# Patient Record
Sex: Male | Born: 1955 | Hispanic: No | Marital: Married
Health system: Northeastern US, Academic
[De-identification: ages and names within clinical notes are randomized; demographics above are authoritative.]

## PROBLEM LIST (undated history)

## (undated) DIAGNOSIS — E785 Hyperlipidemia, unspecified: Secondary | ICD-10-CM

## (undated) DIAGNOSIS — Z87442 Personal history of urinary calculi: Secondary | ICD-10-CM

## (undated) DIAGNOSIS — C4490 Unspecified malignant neoplasm of skin, unspecified: Secondary | ICD-10-CM

## (undated) DIAGNOSIS — I10 Essential (primary) hypertension: Secondary | ICD-10-CM

## (undated) DIAGNOSIS — I712 Thoracic aortic aneurysm, without rupture, unspecified: Secondary | ICD-10-CM

## (undated) DIAGNOSIS — N289 Disorder of kidney and ureter, unspecified: Secondary | ICD-10-CM

## (undated) HISTORY — DX: Hyperlipidemia, unspecified: E78.5

## (undated) HISTORY — PX: LITHOTRIPSY: SUR834

## (undated) HISTORY — PX: COLONOSCOPY: SHX174

## (undated) HISTORY — PX: OTHER SURGICAL HISTORY: SHX169

## (undated) HISTORY — PX: SKIN CANCER EXCISION: SHX779

---

## 2012-05-15 DIAGNOSIS — N289 Disorder of kidney and ureter, unspecified: Secondary | ICD-10-CM

## 2012-05-15 HISTORY — DX: Disorder of kidney and ureter, unspecified: N28.9

## 2012-12-26 ENCOUNTER — Emergency Department (HOSPITAL_BASED_OUTPATIENT_CLINIC_OR_DEPARTMENT_OTHER): Payer: 59

## 2012-12-26 ENCOUNTER — Encounter (HOSPITAL_BASED_OUTPATIENT_CLINIC_OR_DEPARTMENT_OTHER): Payer: Self-pay | Admitting: *Deleted

## 2012-12-26 ENCOUNTER — Emergency Department (HOSPITAL_BASED_OUTPATIENT_CLINIC_OR_DEPARTMENT_OTHER)
Admission: EM | Admit: 2012-12-26 | Discharge: 2012-12-26 | Disposition: A | Discharge status: Home / Self Care | Payer: 59 | Attending: Emergency Medicine | Admitting: Emergency Medicine

## 2012-12-26 DIAGNOSIS — Z7982 Long term (current) use of aspirin: Secondary | ICD-10-CM | POA: Insufficient documentation

## 2012-12-26 DIAGNOSIS — R079 Chest pain, unspecified: Secondary | ICD-10-CM

## 2012-12-26 DIAGNOSIS — R0609 Other forms of dyspnea: Secondary | ICD-10-CM | POA: Insufficient documentation

## 2012-12-26 DIAGNOSIS — R0989 Other specified symptoms and signs involving the circulatory and respiratory systems: Secondary | ICD-10-CM | POA: Insufficient documentation

## 2012-12-26 DIAGNOSIS — I10 Essential (primary) hypertension: Secondary | ICD-10-CM | POA: Insufficient documentation

## 2012-12-26 DIAGNOSIS — R0789 Other chest pain: Secondary | ICD-10-CM | POA: Insufficient documentation

## 2012-12-26 DIAGNOSIS — Z87442 Personal history of urinary calculi: Secondary | ICD-10-CM | POA: Insufficient documentation

## 2012-12-26 DIAGNOSIS — R0600 Dyspnea, unspecified: Secondary | ICD-10-CM

## 2012-12-26 LAB — COMPREHENSIVE METABOLIC PANEL
ALT: 26 U/L (ref 0–53)
Alkaline Phosphatase: 68 U/L (ref 39–117)
CO2: 25 mEq/L (ref 19–32)
Calcium: 9.5 mg/dL (ref 8.4–10.5)
Chloride: 105 mEq/L (ref 96–112)
GFR calc Af Amer: 84 mL/min — ABNORMAL LOW (ref >90)
GFR calc non Af Amer: 73 mL/min — ABNORMAL LOW (ref >90)
Glucose, Bld: 97 mg/dL (ref 70–99)
Sodium: 140 mEq/L (ref 135–145)
Total Bilirubin: 0.3 mg/dL (ref 0.3–1.2)

## 2012-12-26 LAB — CBC WITH DIFFERENTIAL/PLATELET
Eosinophils Relative: 3 % (ref 0–5)
HCT: 44.6 % (ref 39.0–52.0)
Lymphocytes Relative: 29 % (ref 12–46)
Lymphs Abs: 1.6 10*3/uL (ref 0.7–4.0)
MCV: 89.6 fL (ref 78.0–100.0)
Neutro Abs: 3.4 10*3/uL (ref 1.7–7.7)
Platelets: 202 10*3/uL (ref 150–400)
RBC: 4.98 MIL/uL (ref 4.22–5.81)
WBC: 5.7 10*3/uL (ref 4.0–10.5)

## 2012-12-26 MED ORDER — IOHEXOL 350 MG/ML SOLN
100.0000 mL | Freq: Once | INTRAVENOUS | Status: AC | PRN
Start: 2012-12-26 — End: 2012-12-26
  Administered 2012-12-26: 100 mL via INTRAVENOUS

## 2012-12-26 MED ORDER — GI COCKTAIL ~~LOC~~
30.0000 mL | Freq: Once | ORAL | Status: AC
  Administered 2012-12-26: 30 mL via ORAL
  Filled 2012-12-26: qty 30

## 2012-12-26 NOTE — ED Notes (Signed)
Patient is in no acute respiratory distress. He is speaking complete sentences. Breath sounds are equal and clear. Oxygen saturation is 99% on room air. Rt will continue to monitor.

## 2012-12-26 NOTE — ED Provider Notes (Signed)
History     CSN: [id number]  Arrival date & time 12/26/12  0906   First MD Initiated Contact with Patient 12/26/12 [phone]      Chief Complaint  Patient presents with  . Shortness of Breath    (Consider location/radiation/quality/duration/timing/severity/associated sxs/prior treatment) HPI Comments: Patient states was sleeping last night and woke with a sensation of not being able to breathe or catch his breath.  This lasted for several hours, then improved.  He went to work, then stood up and became dizzy and had slight shortness of breath.  He comes in for evaluation of this.  No fevers, chills.  No cough.  No ill contacts.  He has had these episodes before but this was the worst yet.  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  While in [place] several months ago, he had kidney stones and had a pre-op ekg performed.  He was told that this was suggestive of an old mi.  He then had a stress test and echocardiogram, and what sounds like a coronary ct.  He was told that all of there were okay.   Patient is a 57 y.o. male presenting with shortness of breath. The history is provided by the patient.  Shortness of Breath  Episode onset: early this morning. The problem occurs occasionally. The problem has been resolved. The problem is moderate. Nothing relieves the symptoms. Nothing aggravates the symptoms. Associated symptoms include shortness of breath.    Past Medical History  Diagnosis Date  . Hypertension     Past Surgical History  Procedure Date  . Kidney stones     No family history on file.  History  Substance Use Topics  . Smoking status: Never Smoker   . Smokeless tobacco: Not on file  . Alcohol Use: Yes      Review of Systems  Respiratory: Positive for shortness of breath.   All other systems reviewed and are negative.    Allergies  Review of patient's allergies indicates no known allergies.  Home Medications   Current Outpatient Rx  Name  Route   Sig  Dispense  Refill  . ASPIRIN 81 MG PO TABS   Oral   Take 81 mg by mouth daily.         . TESTOSTERONE TD   Transdermal   Place onto the skin.           BP 143/93  Pulse 77  Temp 97.6 F (36.4 C) (Oral)  Resp 20  Ht 5\' 11"  (1.803 m)  Wt 265 lb (120.203 kg)  BMI 36.96 kg/m2  SpO2 99%  Physical Exam  Nursing note and vitals reviewed. Constitutional: He is oriented to person, place, and time. He appears well-developed and well-nourished. No distress.  HENT:  Head: Normocephalic and atraumatic.  Mouth/Throat: Oropharynx is clear and moist.  Neck: Normal range of motion. Neck supple.  Cardiovascular: Normal rate and regular rhythm.   No murmur heard. Pulmonary/Chest: Effort normal and breath sounds normal. No respiratory distress. He has no wheezes.  Abdominal: Soft. Bowel sounds are normal. He exhibits no distension. There is no tenderness.  Musculoskeletal: Normal range of motion. He exhibits no edema.  Neurological: He is alert and oriented to person, place, and time.  Skin: Skin is warm and dry. He is not diaphoretic.    ED Course  Procedures (including critical care time)   Labs Reviewed  CBC WITH DIFFERENTIAL  COMPREHENSIVE METABOLIC PANEL  TROPONIN I   No  results found.   No diagnosis found.   Date: 12/26/2012  Rate: 72  Rhythm: normal sinus rhythm  QRS Axis: normal  Intervals: normal  ST/T Wave abnormalities: normal  Conduction Disutrbances:none  Narrative Interpretation:   Old EKG Reviewed: none available    MDM  The patient presents here with shortness of breath, tightness in chest that started in the night and woke him up.  He has had an extensive workup into this at [facility] two months ago, including perfusion scan, coronary ct, and stress testing.  I have obtained the records from this and all were negative with a low calcium score.  This does not appear to be cardiac.  The patient continues to have ongoing shortness of breath and is somewhat  apprehensive about discharge.  As he has been traveling for business, I did perform a ct angio of the chest to rule out pe.  This was negative.  At this point, I feel as though I have sufficiently ruled out nearly all emergent pathology.  He will be discharged to home.  To return prn.        Geoffery Lyons, MD 12/26/12 [phone]

## 2012-12-26 NOTE — ED Notes (Signed)
Patient states he woke up around 3am and was having trouble catching his breath, sweating. He states that last November when he had an ekg prior to surgery he was told he had a previous heart attack & had a stress test and was told there was no damage to the heart. Does not feel chest pain at this time, but still some sob

## 2018-08-21 ENCOUNTER — Emergency Department (HOSPITAL_BASED_OUTPATIENT_CLINIC_OR_DEPARTMENT_OTHER): Payer: Managed Care, Other (non HMO)

## 2018-08-21 ENCOUNTER — Encounter (HOSPITAL_BASED_OUTPATIENT_CLINIC_OR_DEPARTMENT_OTHER): Payer: Self-pay | Admitting: *Deleted

## 2018-08-21 ENCOUNTER — Other Ambulatory Visit: Payer: Self-pay

## 2018-08-21 ENCOUNTER — Emergency Department (HOSPITAL_BASED_OUTPATIENT_CLINIC_OR_DEPARTMENT_OTHER)
Admission: EM | Admit: 2018-08-21 | Discharge: 2018-08-21 | Disposition: A | Discharge status: Home / Self Care | Payer: Managed Care, Other (non HMO) | Attending: Emergency Medicine | Admitting: Emergency Medicine

## 2018-08-21 DIAGNOSIS — Z7982 Long term (current) use of aspirin: Secondary | ICD-10-CM | POA: Diagnosis not present

## 2018-08-21 DIAGNOSIS — I10 Essential (primary) hypertension: Secondary | ICD-10-CM | POA: Diagnosis not present

## 2018-08-21 DIAGNOSIS — R0789 Other chest pain: Secondary | ICD-10-CM | POA: Diagnosis not present

## 2018-08-21 DIAGNOSIS — Z79899 Other long term (current) drug therapy: Secondary | ICD-10-CM | POA: Diagnosis not present

## 2018-08-21 DIAGNOSIS — I712 Thoracic aortic aneurysm, without rupture: Secondary | ICD-10-CM | POA: Insufficient documentation

## 2018-08-21 DIAGNOSIS — R079 Chest pain, unspecified: Secondary | ICD-10-CM | POA: Diagnosis present

## 2018-08-21 LAB — CBC
HCT: 40.4 % (ref 39.0–52.0)
Hemoglobin: 14.7 g/dL (ref 13.0–17.0)
MCH: 32.3 pg (ref 26.0–34.0)
MCHC: 36.4 g/dL — AB (ref 30.0–36.0)
MCV: 88.8 fL (ref 78.0–100.0)
Platelets: 232 10*3/uL (ref 150–400)
RBC: 4.55 MIL/uL (ref 4.22–5.81)
RDW: 12.8 % (ref 11.5–15.5)
WBC: 9 10*3/uL (ref 4.0–10.5)

## 2018-08-21 LAB — BASIC METABOLIC PANEL
Anion gap: 10 (ref 5–15)
BUN: 24 mg/dL — AB (ref 8–23)
CALCIUM: 9.3 mg/dL (ref 8.9–10.3)
CO2: 25 mmol/L (ref 22–32)
Chloride: 100 mmol/L (ref 98–111)
Creatinine, Ser: 1.23 mg/dL (ref 0.61–1.24)
GFR calc Af Amer: >60 mL/min (ref >60)
GFR calc non Af Amer: >60 mL/min (ref >60)
GLUCOSE: 95 mg/dL (ref 70–99)
Potassium: 3.8 mmol/L (ref 3.5–5.1)
Sodium: 135 mmol/L (ref 135–145)

## 2018-08-21 LAB — TROPONIN I: Troponin I: <0.03 ng/mL (ref <0.03)

## 2018-08-21 NOTE — ED Provider Notes (Signed)
[facility] Provider Note   CSN: [id number] Arrival date & time: 08/21/18  2034     History   Chief Complaint Chief Complaint  Patient presents with  . Chest Pain    HPI John Hammond is a 62 y.o. male.  He has a history of a known thoracic aneurysm.  It sounds like this is being followed out of [facility].  He is visiting here for work.  He says he was at the gym bench pressing 300 pounds and left the weight on his chest and between reps.  He also wants to experienced a sudden left lateral sharp chest pain.  The pain continued while he was in the car afterwards and seems to be related to his position.  He rates it as 8 out of 10 at its maximum.  Now laying in bed he says the pain is essentially 0 and only is reproducible with palpation.  He also notices a little bit when he takes a deep breath.  He says he used to be able to bench this way but has not worked out in a while so this may have been excessive for him.  He has had cardiac testing before and says he has no known coronary disease.  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  No shortness of breath no leg pain or swelling.  Not on anticoagulation.  The history is provided by the patient.  Chest Pain   This is a new problem. The current episode started 1 to 2 hours ago. The problem has been rapidly improving. The pain is associated with exertion, lifting, movement and breathing. The pain is present in the lateral region. The pain is at a severity of 8/10. The quality of the pain is described as sharp. The pain does not radiate. The symptoms are aggravated by certain positions, deep breathing and exertion. Pertinent negatives include no abdominal pain, no back pain, no cough, no diaphoresis, no dizziness, no fever, no hemoptysis, no leg pain, no lower extremity edema, no nausea, no near-syncope, no numbness, no shortness of breath and  no vomiting. He has tried rest for the symptoms. The treatment provided moderate relief. Risk factors include male gender.  His past medical history is significant for aortic aneurysm and hypertension.    History reviewed. No pertinent past medical history.  There are no active problems to display for this patient.   Past Surgical History:  Procedure Laterality Date  . kidney stones          Home Medications    Prior to Admission medications   Medication Sig Start Date End Date Taking? Authorizing Provider  aspirin 81 MG tablet Take 81 mg by mouth daily.   Yes [provider]  losartan (COZAAR) 25 MG tablet Take 25 mg by mouth daily.   Yes [provider]  LOVASTATIN PO Take by mouth.   Yes [provider]  TESTOSTERONE TD Place onto the skin.    [provider]    Family History No family history on file.  Social History Social History   Tobacco Use  . Smoking status: Never Smoker  . Smokeless tobacco: Never Used  Substance Use Topics  . Alcohol use: Yes  . Drug use: No     Allergies   Patient has no known allergies.   Review of Systems Review of Systems  Constitutional: Negative  for diaphoresis and fever.  HENT: Negative for sore throat.   Eyes: Negative for visual disturbance.  Respiratory: Negative for cough, hemoptysis and shortness of breath.   Cardiovascular: Positive for chest pain. Negative for near-syncope.  Gastrointestinal: Negative for abdominal pain, nausea and vomiting.  Genitourinary: Negative for dysuria.  Musculoskeletal: Negative for back pain and neck pain.  Skin: Negative for rash.  Neurological: Negative for dizziness and numbness.     Physical Exam Updated Vital Signs BP 127/83   Pulse (!) 116   Temp 97.9 F (36.6 C) (Oral)   Resp 20   Ht 5' 11.5" (1.816 m)   Wt 122.5 kg   SpO2 93%   BMI 37.13 kg/m   Physical Exam  Constitutional: He appears well-developed and well-nourished.  HENT:    Head: Normocephalic and atraumatic.  Eyes: Conjunctivae are normal.  Neck: Neck supple.  Cardiovascular: Normal rate, regular rhythm and normal pulses.  No murmur heard. Pulmonary/Chest: Effort normal and breath sounds normal. No respiratory distress. He exhibits tenderness. He exhibits no crepitus.  He has some point chest wall tenderness on the anterior lateral left side lower rib cage.  Abdominal: Soft. There is no tenderness.  Musculoskeletal: Normal range of motion. He exhibits no edema.       Right lower leg: He exhibits no tenderness and no edema.       Left lower leg: He exhibits no tenderness and no edema.  Neurological: He is alert.  Skin: Skin is warm and dry.  Psychiatric: He has a normal mood and affect.  Nursing note and vitals reviewed.    ED Treatments / Results  Labs (all labs ordered are listed, but only abnormal results are displayed) Labs Reviewed  BASIC METABOLIC PANEL - Abnormal; Notable for the following components:      Result Value   BUN 24 (*)    All other components within normal limits  CBC - Abnormal; Notable for the following components:   MCHC 36.4 (*)    All other components within normal limits  TROPONIN I    EKG EKG Interpretation  Date/Time:  Monday August 21 2018 20:47:42 EDT Ventricular Rate:  113 PR Interval:  178 QRS Duration: 82 QT Interval:  320 QTC Calculation: 438 R Axis:   9 Text Interpretation:  Sinus tachycardia Inferior infarct , age undetermined Abnormal ECG increased rate from prior 1/14 Confirmed by Aletta Edouard [phone]) on 08/21/2018 8:58:14 PM   Radiology Dg Chest 2 View  Result Date: 08/21/2018 CLINICAL DATA:  LEFT lower chest pain while lifting weights at gym. EXAM: CHEST - 2 VIEW COMPARISON:  CT chest December 18, 2012 FINDINGS: Cardiomediastinal silhouette is unremarkable for this low inspiratory examination with crowded vasculature markings. The lungs are clear without pleural effusions or focal  consolidations. Similarly mildly elevated RIGHT hemidiaphragm. Trachea projects midline and there is no pneumothorax. Included soft tissue planes and osseous structures are non-suspicious. IMPRESSION: No active cardiopulmonary process. Electronically Signed   By: Elon Alas M.D.   On: 08/21/2018 22:00    Procedures Procedures (including critical care time)  Medications Ordered in ED Medications - No data to display   Initial Impression / Assessment and Plan / ED Course  I have reviewed the triage vital signs and the nursing notes.  Pertinent labs & imaging results that were available during my care of the patient were reviewed by me and considered in my medical decision making (see chart for details).  Clinical Course as of Aug 23 1127  Mon Aug 21, 2018  2137 Patient with a known thoracic aneurysm here with chest pain after bench pressing.  The clearly seems to be some reproducible component to his pain but he is at high risk with this known thoracic aneurysm that they have been following.  Initially he was agreeable to get the chest CT but now he is declining.  He looks very comfortable laying in bed and only can reproduce the pain with taking a deep breath or twisting.  He is not tachycardic here and not hypoxic.  He understands that we are talking about a serious outcome of his aneurysm is expanding or has dissected but he is declining the test.  He would like to follow-up with a specialist in [place].   [MB]  2211 Patient's initial results will come back and be unremarkable.  He is not interested in sticking out for a second troponin and does not want a chest CT.  He states he will come back if his symptoms get worse.  Now he is fairly comfortable that they are isolated chest wall.  He understands the risks of this and is able to recite them back to me.   [MB]    Clinical Course User Index [MB] Hayden Rasmussen, MD     Final Clinical Impressions(s) / ED Diagnoses   Final  diagnoses:  Atypical chest pain    ED Discharge Orders    None       Hayden Rasmussen, MD 08/22/18 1129

## 2018-08-21 NOTE — Discharge Instructions (Addendum)
You were evaluated in the emergency department for his left-sided chest pain that you experienced while you were lifting weights.  You had chest x-ray EKG and blood work that did not show an obvious cause of your pain.  I recommended that we get a second troponin and a CAT scan to evaluate for more serious causes of your pain but you declined this test.  Please follow-up with your doctor and return if any worsening symptoms.

## 2018-08-21 NOTE — ED Triage Notes (Addendum)
Chest pain x 20 minutes ago while at the gym. He is pale on arrival. Cardiac hx.

## 2018-08-21 NOTE — ED Notes (Signed)
Pt. John Hammond he would rather wait to get the CT of his aorta done back at his home.

## 2019-12-26 ENCOUNTER — Encounter (HOSPITAL_BASED_OUTPATIENT_CLINIC_OR_DEPARTMENT_OTHER): Payer: Self-pay | Admitting: Emergency Medicine

## 2019-12-26 ENCOUNTER — Emergency Department (HOSPITAL_BASED_OUTPATIENT_CLINIC_OR_DEPARTMENT_OTHER)
Admission: EM | Admit: 2019-12-26 | Discharge: 2019-12-26 | Disposition: A | Discharge status: Home / Self Care | Payer: BC Managed Care – PPO | Attending: Emergency Medicine | Admitting: Emergency Medicine

## 2019-12-26 ENCOUNTER — Other Ambulatory Visit: Payer: Self-pay

## 2019-12-26 ENCOUNTER — Emergency Department (HOSPITAL_BASED_OUTPATIENT_CLINIC_OR_DEPARTMENT_OTHER): Payer: BC Managed Care – PPO

## 2019-12-26 DIAGNOSIS — R0602 Shortness of breath: Secondary | ICD-10-CM | POA: Insufficient documentation

## 2019-12-26 DIAGNOSIS — Z8616 Personal history of COVID-19: Secondary | ICD-10-CM | POA: Diagnosis not present

## 2019-12-26 DIAGNOSIS — Z7982 Long term (current) use of aspirin: Secondary | ICD-10-CM | POA: Insufficient documentation

## 2019-12-26 DIAGNOSIS — Z79899 Other long term (current) drug therapy: Secondary | ICD-10-CM | POA: Diagnosis not present

## 2019-12-26 NOTE — ED Provider Notes (Signed)
[facility] Provider Note   CSN: [id number] Arrival date & time: 12/26/19  1214     History Chief Complaint  Patient presents with  . Shortness of Breath    John Hammond is a 64 y.o. male.  Patient with history of thoracic aortic aneurysm, reportedly stable over the past 6 years, followed twice a year at [facility] --presents with complaint of shortness of breath.  Patient was diagnosed with coronavirus about 2 weeks ago.  His symptoms have generally been improving however he has noted some shortness of breath that is exertional over the past several days.  Particularly pronounced when he is going upstairs.  He denies any chest pains.  No diaphoresis or exertional pain.  No lower extremity swelling or history of blood clots.  No back pain.  History of high blood pressure and high cholesterol.  Patient travels a lot for work and currently resides in [place].  Last international travel was 2 months ago to the [place].        History reviewed. No pertinent past medical history.  There are no problems to display for this patient.   Past Surgical History:  Procedure Laterality Date  . kidney stones         History reviewed. No pertinent family history.  Social History   Tobacco Use  . Smoking status: Never Smoker  . Smokeless tobacco: Never Used  Substance Use Topics  . Alcohol use: Yes  . Drug use: No    Home Medications Prior to Admission medications   Medication Sig Start Date End Date Taking? Authorizing Provider  aspirin 81 MG tablet Take 81 mg by mouth daily.    [provider]  losartan (COZAAR) 25 MG tablet Take 25 mg by mouth daily.    [provider]  LOVASTATIN PO Take by mouth.    [provider]  TESTOSTERONE TD Place onto the skin.    [provider]    Allergies    Patient has no known allergies.  Review of Systems   Review of Systems  Constitutional: Negative for fever.  HENT: Negative for  rhinorrhea and sore throat.   Eyes: Negative for redness.  Respiratory: Positive for shortness of breath. Negative for cough.   Cardiovascular: Negative for chest pain and leg swelling.  Gastrointestinal: Negative for abdominal pain, diarrhea, nausea and vomiting.  Genitourinary: Negative for dysuria.  Musculoskeletal: Negative for myalgias.  Skin: Negative for rash.  Neurological: Negative for headaches.    Physical Exam Updated Vital Signs BP (!) 149/99 (BP Location: Right Arm)   Pulse (!) 103   Temp 98.2 F (36.8 C) (Oral)   Resp 18   SpO2 96%   Physical Exam Vitals and nursing note reviewed.  Constitutional:      Appearance: He is well-developed. He is not diaphoretic.  HENT:     Head: Normocephalic and atraumatic.     Mouth/Throat:     Mouth: Mucous membranes are not dry.  Eyes:     Conjunctiva/sclera: Conjunctivae normal.  Neck:     Vascular: Normal carotid pulses. No carotid bruit or JVD.     Trachea: Trachea normal. No tracheal deviation.  Cardiovascular:     Rate and Rhythm: Normal rate and regular rhythm.     Pulses: No decreased pulses.     Heart sounds: Normal heart sounds, S1 normal and S2 normal. Heart sounds not distant. No murmur.  Pulmonary:     Effort: Pulmonary effort is normal. No respiratory  distress.     Breath sounds: Normal breath sounds. No wheezing, rhonchi or rales.     Comments: Patient speaks in full sentences without any apparent shortness of breath. Chest:     Chest wall: No tenderness.  Abdominal:     General: Bowel sounds are normal.     Palpations: Abdomen is soft.     Tenderness: There is no abdominal tenderness. There is no guarding or rebound.  Musculoskeletal:     Cervical back: Normal range of motion and neck supple. No muscular tenderness.  Skin:    General: Skin is warm and dry.     Coloration: Skin is not pale.  Neurological:     Mental Status: He is alert.     ED Results / Procedures / Treatments   Labs (all labs  ordered are listed, but only abnormal results are displayed) Labs Reviewed - No data to display  EKG EKG Interpretation  Date/Time:  Wednesday December 26 2019 14:25:36 EST Ventricular Rate:  80 PR Interval:    QRS Duration: 84 QT Interval:  382 QTC Calculation: 441 R Axis:   21 Text Interpretation: Sinus rhythm Low voltage, precordial leads No significant change since 9/19 Confirmed by Aletta Edouard [phone]) on 12/26/2019 2:28:54 PM   Radiology No results found.  Procedures Procedures (including critical care time)  Medications Ordered in ED Medications - No data to display  ED Course  I have reviewed the triage vital signs and the nursing notes.  Pertinent labs & imaging results that were available during my care of the patient were reviewed by me and considered in my medical decision making (see chart for details).  Patient seen and examined.  X-ray, EKG ordered.  Will ambulate patient.  Vital signs reviewed and are as follows: BP (!) 149/99 (BP Location: Right Arm)   Pulse (!) 103   Temp 98.2 F (36.8 C) (Oral)   Resp 18   SpO2 96%   2:33 PM patient ambulated and maintained oxygen saturation at 96%.  EKG reviewed.  Chest x-ray without significant pneumonia or other problems (not crossing over, reviewed in PACS).  Patient updated.  We will discharge home.  Patient counseled to monitor symptoms, return with worsening shortness of breath, trouble breathing, fevers, new symptoms or other concerns.  Patient verbalized understanding and agrees with plan.    MDM Rules/Calculators/A&P                      Patient with shortness of breath after coronavirus testing.  Other symptoms are improving.  No lower extremity signs and symptoms of DVT.  Patient without chest pain.  He has ambulated without hypoxia.  Chest x-ray, EKG, reassuring.  No features concerning for ACS.  Patient has a known chronic thoracic aortic aneurysm which is reportedly stable.  Symptoms today are not  consistent with complication from this.  Feel symptoms are consistent with resolving COVID-19 infection.   Final Clinical Impression(s) / ED Diagnoses Final diagnoses:  Shortness of breath  History of 2019 novel coronavirus disease (COVID-19)    Rx / DC Orders ED Discharge Orders    None       Carlisle Cater, PA-C 12/26/19 [place]    Hayden Rasmussen, MD 12/26/19 1815

## 2019-12-26 NOTE — ED Triage Notes (Signed)
Patient COVID + last Saturday and was feeling better before the last couple of days and started to become SOB on exertion.

## 2019-12-26 NOTE — Discharge Instructions (Signed)
Please read and follow all provided instructions.  Your diagnoses today include:  1. Shortness of breath     Tests performed today include:  Chest x-ray   EKG - normal  Vital signs. See below for your results today.   Medications prescribed:   None  Take any prescribed medications only as directed.  Home care instructions:  Follow any educational materials contained in this packet.  BE VERY CAREFUL not to take multiple medicines containing Tylenol (also called acetaminophen). Doing so can lead to an overdose which can damage your liver and cause liver failure and possibly death.   Follow-up instructions: Please follow-up with your primary care provider as needed for further evaluation of your symptoms.   Return instructions:   Please return to the Emergency Department if you experience worsening symptoms.   Return if you develop chest pain, worsening shortness of breath, fever.  Please return if you have any other emergent concerns.  Additional Information:  Your vital signs today were: BP (!) 149/99 (BP Location: Right Arm)   Pulse (!) 103   Temp 98.2 F (36.8 C) (Oral)   Resp 18   SpO2 96%  If your blood pressure (BP) was elevated above 135/85 this visit, please have this repeated by your doctor within one month. --------------

## 2019-12-31 ENCOUNTER — Telehealth: Admit: 2019-12-31 | Payer: PRIVATE HEALTH INSURANCE | Attending: Urology | Primary: Urology

## 2019-12-31 NOTE — Telephone Encounter
Spencer Nelson called and is asking for a prescription refill on his Androgel.  He has not been seen in more than one year, advised he needs an appt and he will need labs drawn.  He verbalized an understanding, He would like a call back from scheduling for an appt, will route the call to the scheduling pool.

## 2020-01-08 ENCOUNTER — Ambulatory Visit: Admit: 2020-01-08 | Payer: PRIVATE HEALTH INSURANCE | Attending: Cardiovascular Disease | Primary: Urology

## 2020-01-08 ENCOUNTER — Encounter: Admit: 2020-01-08 | Payer: PRIVATE HEALTH INSURANCE | Attending: Cardiovascular Disease | Primary: Urology

## 2020-01-30 ENCOUNTER — Ambulatory Visit: Admit: 2020-01-30 | Payer: PRIVATE HEALTH INSURANCE | Attending: Urology | Primary: Urology

## 2020-05-23 ENCOUNTER — Telehealth: Admit: 2020-05-23 | Payer: PRIVATE HEALTH INSURANCE | Attending: Urology | Primary: Urology

## 2020-05-23 NOTE — Telephone Encounter
Pt requesting PSA labs

## 2020-05-23 NOTE — Telephone Encounter
Last seen by Dr. Daphine Deutscher in 2017. Was previously seen by Dr. Laveda Norman at [facility] in 2019. No PSA recorded since 2019.I let him know that he has not been seen since 2017 and he will need to keep his appointment for orders to be placed and to be evaluated. He was understanding. I let him know if he see's his PCP regularly he can ask them to order in the meantime. He said he travels often and does not f/u with his physicians regularly. I let him know I would inform Dr. Daphine Deutscher in the event he wanted to order something now but likely that he would need to come into the office to establish a relationship again and at that point we would order blood work.

## 2020-06-09 ENCOUNTER — Encounter: Admit: 2020-06-09 | Payer: PRIVATE HEALTH INSURANCE | Attending: Cardiovascular Disease | Primary: Urology

## 2020-06-13 MED ORDER — LOVASTATIN 10 MG TABLET
10 mg | ORAL_TABLET | 2 refills | Status: AC
Start: 2020-06-13 — End: 2022-10-07

## 2020-06-30 ENCOUNTER — Ambulatory Visit: Payer: BC Managed Care – PPO | Admitting: Podiatry

## 2020-08-16 ENCOUNTER — Encounter: Admit: 2020-08-16 | Payer: PRIVATE HEALTH INSURANCE | Attending: Cardiovascular Disease | Primary: Urology

## 2020-08-18 ENCOUNTER — Encounter: Admit: 2020-08-18 | Payer: PRIVATE HEALTH INSURANCE | Attending: Cardiovascular Disease | Primary: Urology

## 2020-08-21 MED ORDER — LOSARTAN 50 MG TABLET
50 mg | ORAL_TABLET | 1 refills | Status: AC
Start: 2020-08-21 — End: 2020-11-14

## 2020-10-09 ENCOUNTER — Ambulatory Visit: Admit: 2020-10-09 | Payer: PRIVATE HEALTH INSURANCE | Attending: Urology | Primary: Urology

## 2020-11-14 ENCOUNTER — Encounter: Admit: 2020-11-14 | Payer: PRIVATE HEALTH INSURANCE | Attending: Cardiovascular Disease | Primary: Urology

## 2020-11-14 MED ORDER — LOSARTAN 50 MG TABLET
50 mg | ORAL_TABLET | 1 refills | Status: AC
Start: 2020-11-14 — End: 2021-05-22

## 2020-11-14 NOTE — Telephone Encounter
Script pended for your signature, thank you

## 2021-02-25 ENCOUNTER — Ambulatory Visit: Admit: 2021-02-25 | Payer: PRIVATE HEALTH INSURANCE | Attending: Cardiovascular Disease | Primary: Urology

## 2021-02-27 ENCOUNTER — Emergency Department (HOSPITAL_BASED_OUTPATIENT_CLINIC_OR_DEPARTMENT_OTHER)
Admission: EM | Admit: 2021-02-27 | Discharge: 2021-02-28 | Disposition: A | Discharge status: Home / Self Care | Payer: BC Managed Care – PPO | Attending: Emergency Medicine | Admitting: Emergency Medicine

## 2021-02-27 ENCOUNTER — Emergency Department (HOSPITAL_BASED_OUTPATIENT_CLINIC_OR_DEPARTMENT_OTHER): Payer: BC Managed Care – PPO

## 2021-02-27 ENCOUNTER — Other Ambulatory Visit: Payer: Self-pay

## 2021-02-27 ENCOUNTER — Encounter (HOSPITAL_BASED_OUTPATIENT_CLINIC_OR_DEPARTMENT_OTHER): Payer: Self-pay | Admitting: Emergency Medicine

## 2021-02-27 DIAGNOSIS — R000 Tachycardia, unspecified: Secondary | ICD-10-CM | POA: Diagnosis not present

## 2021-02-27 DIAGNOSIS — J069 Acute upper respiratory infection, unspecified: Secondary | ICD-10-CM

## 2021-02-27 DIAGNOSIS — Z79899 Other long term (current) drug therapy: Secondary | ICD-10-CM | POA: Diagnosis not present

## 2021-02-27 DIAGNOSIS — I10 Essential (primary) hypertension: Secondary | ICD-10-CM | POA: Insufficient documentation

## 2021-02-27 DIAGNOSIS — R059 Cough, unspecified: Secondary | ICD-10-CM | POA: Diagnosis present

## 2021-02-27 DIAGNOSIS — Z7982 Long term (current) use of aspirin: Secondary | ICD-10-CM | POA: Insufficient documentation

## 2021-02-27 DIAGNOSIS — Z20822 Contact with and (suspected) exposure to covid-19: Secondary | ICD-10-CM | POA: Insufficient documentation

## 2021-02-27 HISTORY — DX: Essential (primary) hypertension: I10

## 2021-02-27 HISTORY — DX: Disorder of kidney and ureter, unspecified: N28.9

## 2021-02-27 LAB — CBC WITH DIFFERENTIAL/PLATELET
Abs Immature Granulocytes: 0.03 10*3/uL (ref 0.00–0.07)
Basophils Absolute: 0 10*3/uL (ref 0.0–0.1)
Basophils Relative: 1 %
Eosinophils Absolute: 0.2 10*3/uL (ref 0.0–0.5)
Eosinophils Relative: 2 %
HCT: 39.3 % (ref 39.0–52.0)
Hemoglobin: 13.6 g/dL (ref 13.0–17.0)
Immature Granulocytes: 0 %
Lymphocytes Relative: 16 %
Lymphs Abs: 1.3 10*3/uL (ref 0.7–4.0)
MCH: 30.7 pg (ref 26.0–34.0)
MCHC: 34.6 g/dL (ref 30.0–36.0)
MCV: 88.7 fL (ref 80.0–100.0)
Monocytes Absolute: 0.8 10*3/uL (ref 0.1–1.0)
Monocytes Relative: 10 %
Neutro Abs: 6.1 10*3/uL (ref 1.7–7.7)
Neutrophils Relative %: 71 %
Platelets: 217 10*3/uL (ref 150–400)
RBC: 4.43 MIL/uL (ref 4.22–5.81)
RDW: 12.7 % (ref 11.5–15.5)
WBC: 8.4 10*3/uL (ref 4.0–10.5)
nRBC: 0 % (ref 0.0–0.2)

## 2021-02-27 MED ORDER — ACETAMINOPHEN 500 MG PO TABS
1000.0000 mg | ORAL_TABLET | Freq: Once | ORAL | Status: AC | PRN
  Administered 2021-02-27: 1000 mg via ORAL
  Filled 2021-02-27: qty 2

## 2021-02-27 NOTE — ED Triage Notes (Signed)
Pt with URI, cough and congestion x 4 days with increased shob today.

## 2021-02-27 NOTE — ED Provider Notes (Signed)
[name] Provider Note   CSN: [id number] Arrival date & time: 02/27/21  2236     History Chief Complaint  Patient presents with  . Shortness of Breath    John Hammond is a 65 y.o. male.  65 yo M with a chief complaints of cough fever and shortness of breath.  This been going on for about 4 days now.  The patient has been traveling quite a bit and had arrived in town to go to a retirement party.  He feels like he is having trouble breathing when he gets up to walk around.  Feels very congested.  Denies abdominal pain nausea vomiting or diarrhea.  Denies urinary symptoms.  He was noted to be febrile here though does not know that he had fever at home.  No known sick contacts.  The history is provided by the patient.  Shortness of Breath Associated symptoms: cough   Associated symptoms: no abdominal pain, no chest pain, no fever, no headaches, no rash and no vomiting   Illness Severity:  Moderate Onset quality:  Gradual Duration:  4 days Timing:  Constant Progression:  Worsening Chronicity:  New Associated symptoms: congestion, cough and shortness of breath   Associated symptoms: no abdominal pain, no chest pain, no diarrhea, no fever, no headaches, no myalgias, no rash and no vomiting        Past Medical History:  Diagnosis Date  . Hypertension   . Renal disorder     There are no problems to display for this patient.   Past Surgical History:  Procedure Laterality Date  . kidney stones    . LITHOTRIPSY         No family history on file.  Social History   Tobacco Use  . Smoking status: Never Smoker  . Smokeless tobacco: Never Used  Substance Use Topics  . Alcohol use: Yes  . Drug use: No    Home Medications Prior to Admission medications   Medication Sig Start Date End Date Taking? Authorizing Provider  benzonatate (TESSALON) 100 MG capsule Take 1 capsule (100 mg total) by mouth every 8 (eight) hours. 02/28/21  Yes Deno Etienne, DO  aspirin 81 MG tablet Take 81 mg by mouth daily.    [provider]  losartan (COZAAR) 25 MG tablet Take 25 mg by mouth daily.    [provider]  LOVASTATIN PO Take by mouth.    [provider]  TESTOSTERONE TD Place onto the skin.    [provider]    Allergies    Patient has no known allergies.  Review of Systems   Review of Systems  Constitutional: Negative for chills and fever.  HENT: Positive for congestion. Negative for facial swelling.   Eyes: Negative for discharge and visual disturbance.  Respiratory: Positive for cough and shortness of breath.   Cardiovascular: Negative for chest pain and palpitations.  Gastrointestinal: Negative for abdominal pain, diarrhea and vomiting.  Musculoskeletal: Negative for arthralgias and myalgias.  Skin: Negative for color change and rash.  Neurological: Negative for tremors, syncope and headaches.  Psychiatric/Behavioral: Negative for confusion and dysphoric mood.    Physical Exam Updated Vital Signs BP 134/71   Pulse 99   Temp 99.8 F (37.7 C) (Oral)   Resp 18   Ht 6' (1.829 m)   Wt 131.5 kg   SpO2 95%   BMI 39.33 kg/m   Physical Exam Vitals and nursing note reviewed.  Constitutional:  Appearance: He is well-developed.     Comments: BMI 40  HENT:     Head: Normocephalic and atraumatic.  Eyes:     Pupils: Pupils are equal, round, and reactive to light.  Neck:     Vascular: No JVD.  Cardiovascular:     Rate and Rhythm: Regular rhythm. Tachycardia present.     Heart sounds: No murmur heard. No friction rub. No gallop.   Pulmonary:     Effort: No respiratory distress.     Breath sounds: No wheezing.  Abdominal:     General: There is no distension.     Tenderness: There is no guarding or rebound.  Musculoskeletal:        General: Normal range of motion.     Cervical back: Normal range of motion and neck supple.  Skin:    Coloration: Skin is not pale.     Findings: No  rash.  Neurological:     Mental Status: He is alert and oriented to person, place, and time.  Psychiatric:        Behavior: Behavior normal.     ED Results / Procedures / Treatments   Labs (all labs ordered are listed, but only abnormal results are displayed) Labs Reviewed  COMPREHENSIVE METABOLIC PANEL - Abnormal; Notable for the following components:      Result Value   Sodium 132 (*)    CO2 21 (*)    Glucose, Bld 176 (*)    Calcium 8.5 (*)    All other components within normal limits  CULTURE, BLOOD (ROUTINE X 2)  CULTURE, BLOOD (ROUTINE X 2)  SARS CORONAVIRUS 2 (TAT 6-24 HRS)  LACTIC ACID, PLASMA  CBC WITH DIFFERENTIAL/PLATELET  PROTIME-INR  LACTIC ACID, PLASMA    EKG EKG Interpretation  Date/Time:  Friday February 27 2021 22:54:53 EDT Ventricular Rate:  113 PR Interval:  177 QRS Duration: 82 QT Interval:  320 QTC Calculation: 439 R Axis:   16 Text Interpretation: Sinus tachycardia Probable left atrial enlargement No significant change since last tracing Confirmed by Dorie Rank [phone]) on 02/27/2021 10:57:05 PM   Radiology DG Chest Port 1 View  Result Date: 02/27/2021 CLINICAL DATA:  Cough, shortness of breath EXAM: PORTABLE CHEST 1 VIEW COMPARISON:  12/26/2019 FINDINGS: Low lung volumes. Stable mild elevation of the right hemidiaphragm. Right base atelectasis. Left lung clear. Heart is normal size. No effusions or acute bony abnormality. IMPRESSION: Right base atelectasis. Electronically Signed   By: Rolm Baptise M.D.   On: 02/27/2021 23:25    Procedures Procedures   Medications Ordered in ED Medications  acetaminophen (TYLENOL) tablet 1,000 mg (1,000 mg Oral Given 02/27/21 2337)    ED Course  I have reviewed the triage vital signs and the nursing notes.  Pertinent labs & imaging results that were available during my care of the patient were reviewed by me and considered in my medical decision making (see chart for details).    MDM Rules/Calculators/A&P                           65 yo M with a chief complaints of cough and shortness of breath.  Going on for the past 4 days.  Patient is in no acute distress his clear lung sounds.  Signs of upper respiratory illness.  Will obtain a chest x-ray blood work reassess.  Chest x-ray viewed by me without focal infiltrate.  Patient is able to ambulate here without hypoxia.  No  significant anemia no significant electrolyte abnormality.  Without urinary symptoms that feels unlikely has a urinary tract infection.  Will discharge the patient home.  Have him treat supportively.  John Hammond was evaluated in Emergency Department on 02/28/2021 for the symptoms described in the history of present illness. He/she was evaluated in the context of the global COVID-19 pandemic, which necessitated consideration that the patient might be at risk for infection with the SARS-CoV-2 virus that causes COVID-19. Institutional protocols and algorithms that pertain to the evaluation of patients at risk for COVID-19 are in a state of rapid change based on information released by regulatory bodies including the CDC and federal and state organizations. These policies and algorithms were followed during the patient's care in the ED.  12:48 AM:  I have discussed the diagnosis/risks/treatment options with the patient and believe the pt to be eligible for discharge home to follow-up with PCP. We also discussed returning to the ED immediately if new or worsening sx occur. We discussed the sx which are most concerning (e.g., sudden worsening pain, fever, inability to tolerate by mouth) that necessitate immediate return. Medications administered to the patient during their visit and any new prescriptions provided to the patient are listed below.  Medications given during this visit Medications  acetaminophen (TYLENOL) tablet 1,000 mg (1,000 mg Oral Given 02/27/21 2337)     The patient appears reasonably screen and/or stabilized for discharge and I  doubt any other medical condition or other [facility] requiring further screening, evaluation, or treatment in the ED at this time prior to discharge.    Final Clinical Impression(s) / ED Diagnoses Final diagnoses:  Viral URI with cough    Rx / DC Orders ED Discharge Orders         Ordered    benzonatate (TESSALON) 100 MG capsule  Every 8 hours        02/28/21 [place], Enola, DO 02/28/21 [phone]

## 2021-02-28 LAB — COMPREHENSIVE METABOLIC PANEL
ALT: 30 U/L (ref 0–44)
AST: 29 U/L (ref 15–41)
Albumin: 3.8 g/dL (ref 3.5–5.0)
Alkaline Phosphatase: 57 U/L (ref 38–126)
Anion gap: 10 (ref 5–15)
BUN: 13 mg/dL (ref 8–23)
CO2: 21 mmol/L — ABNORMAL LOW (ref 22–32)
Calcium: 8.5 mg/dL — ABNORMAL LOW (ref 8.9–10.3)
Chloride: 101 mmol/L (ref 98–111)
Creatinine, Ser: 1.19 mg/dL (ref 0.61–1.24)
GFR, Estimated: >60 mL/min (ref >60)
Glucose, Bld: 176 mg/dL — ABNORMAL HIGH (ref 70–99)
Potassium: 3.6 mmol/L (ref 3.5–5.1)
Sodium: 132 mmol/L — ABNORMAL LOW (ref 135–145)
Total Bilirubin: 0.6 mg/dL (ref 0.3–1.2)
Total Protein: 7.2 g/dL (ref 6.5–8.1)

## 2021-02-28 LAB — LACTIC ACID, PLASMA: Lactic Acid, Venous: 1.5 mmol/L (ref 0.5–1.9)

## 2021-02-28 LAB — PROTIME-INR
INR: 1.1 (ref 0.8–1.2)
Prothrombin Time: 14.1 seconds (ref 11.4–15.2)

## 2021-02-28 MED ORDER — BENZONATATE 100 MG PO CAPS: 100.0000 mg | ORAL_CAPSULE | Freq: Three times a day (TID) | ORAL | 0 refills | Status: DC

## 2021-02-28 NOTE — Discharge Instructions (Signed)
Your chest x-ray did not show pneumonia.  Most likely you have a virus.  Treat this supportively.  I prescribed you cough medicine this will not stop your cough but may make it slightly better.  Please return for worsening trouble breathing.  Follow-up with your family doctor.

## 2021-02-28 NOTE — ED Notes (Signed)
Pt SpO2 was between 90-93 while ambulation. Heart rate was at 99-101 while ambulating, walked the whole unit.

## 2021-03-02 ENCOUNTER — Other Ambulatory Visit: Payer: Self-pay

## 2021-03-02 ENCOUNTER — Emergency Department (HOSPITAL_BASED_OUTPATIENT_CLINIC_OR_DEPARTMENT_OTHER)
Admission: EM | Admit: 2021-03-02 | Discharge: 2021-03-02 | Discharge status: Absent without leave | Payer: BC Managed Care – PPO

## 2021-03-02 LAB — SARS CORONAVIRUS 2 (TAT 6-24 HRS): SARS Coronavirus 2: NEGATIVE

## 2021-03-02 NOTE — ED Notes (Addendum)
Pt did not need to check in.  Just wanted to check on covid results that he had not received in my chart  Had been over 48 hours and no results.per lab sample had been been over look and did not start running till this am

## 2021-03-04 ENCOUNTER — Encounter: Admit: 2021-03-04 | Payer: PRIVATE HEALTH INSURANCE | Attending: Cardiovascular Disease | Primary: Urology

## 2021-03-04 ENCOUNTER — Emergency Department (HOSPITAL_BASED_OUTPATIENT_CLINIC_OR_DEPARTMENT_OTHER): Payer: BC Managed Care – PPO

## 2021-03-04 ENCOUNTER — Other Ambulatory Visit (HOSPITAL_BASED_OUTPATIENT_CLINIC_OR_DEPARTMENT_OTHER): Payer: Self-pay

## 2021-03-04 ENCOUNTER — Other Ambulatory Visit: Payer: Self-pay

## 2021-03-04 ENCOUNTER — Emergency Department (HOSPITAL_BASED_OUTPATIENT_CLINIC_OR_DEPARTMENT_OTHER)
Admission: EM | Admit: 2021-03-04 | Discharge: 2021-03-04 | Disposition: A | Discharge status: Home / Self Care | Payer: BC Managed Care – PPO | Attending: Emergency Medicine | Admitting: Emergency Medicine

## 2021-03-04 ENCOUNTER — Encounter (HOSPITAL_BASED_OUTPATIENT_CLINIC_OR_DEPARTMENT_OTHER): Payer: Self-pay

## 2021-03-04 DIAGNOSIS — Z7982 Long term (current) use of aspirin: Secondary | ICD-10-CM | POA: Insufficient documentation

## 2021-03-04 DIAGNOSIS — Z79899 Other long term (current) drug therapy: Secondary | ICD-10-CM | POA: Insufficient documentation

## 2021-03-04 DIAGNOSIS — I10 Essential (primary) hypertension: Secondary | ICD-10-CM | POA: Insufficient documentation

## 2021-03-04 DIAGNOSIS — J069 Acute upper respiratory infection, unspecified: Secondary | ICD-10-CM

## 2021-03-04 DIAGNOSIS — R059 Cough, unspecified: Secondary | ICD-10-CM | POA: Diagnosis present

## 2021-03-04 MED ORDER — HYDROCOD POLST-CPM POLST ER 10-8 MG/5ML PO SUER
5.0000 mL | Freq: Two times a day (BID) | ORAL | 0 refills | Status: DC | PRN
  Filled 2021-03-04: qty 70, 7d supply, fill #0

## 2021-03-04 NOTE — ED Triage Notes (Addendum)
Pt was seen here Friday for cough/SOB congestion. Was discharged with medicine without relief. States he feels like he has to constantly cough. Had a coughing fit last night where he now feels a "bulging" in his epigastric region. Denies chest pain. Dyspnea on exertion noted when entering room, states this is from feeling like he needs to cough.  Had negative covid test here Friday

## 2021-03-04 NOTE — Discharge Instructions (Addendum)
Take the medications as needed for cough and congestion.  Consider following up with your primary care doctor or urgent care doctor if the symptoms have not improved in the next week

## 2021-03-04 NOTE — ED Provider Notes (Signed)
[facility] EMERGENCY DEPARTMENT Provider Note   CSN: [id number] Arrival date & time: 03/04/21  9381     History Chief Complaint  Patient presents with  . Cough    John Hammond is a 65 y.o. male.  HPI   Patient presents to the ED for evaluation of trouble with persistent cough and congestion.  Patient initially started having symptoms about a week ago.  He has been having trouble with cough and has felt feverish and also has been feeling intermittently short of breath.  Patient does travel a lot.  He has not had any trouble with leg swelling or leg pain.  Patient has not had any issu during that evaluation he had laboratory tests as well as blood cultures.  He also had a chest x-ray es with abdominal pain nausea vomiting or diarrhea.  He was seen in the emergency room on April 1.  Patient had laboratory tests and x-rays.  He was negative for Covid and did not have evidence of pneumonia.  Patient was discharged home with prescription for Tessalon.  Patient states he is still coughing a lot especially at night.  He is not sleeping well because of that.  He tried taking the Tessalon but does not help at all and he would like something stronger for his cough.  He also feels soreness in the lower aspect anteriorly of both of his ribs.  It hurts when he coughs.  Past Medical History:  Diagnosis Date  . Hypertension   . Renal disorder     There are no problems to display for this patient.   Past Surgical History:  Procedure Laterality Date  . kidney stones    . LITHOTRIPSY         History reviewed. No pertinent family history.  Social History   Tobacco Use  . Smoking status: Never Smoker  . Smokeless tobacco: Never Used  Substance Use Topics  . Alcohol use: Not Currently  . Drug use: No    Home Medications Prior to Admission medications   Medication Sig Start Date End Date Taking? Authorizing Provider  aspirin 81 MG tablet Take 81 mg by mouth daily.   Yes  [provider]  benzonatate (TESSALON) 100 MG capsule Take 1 capsule (100 mg total) by mouth every 8 (eight) hours. 02/28/21  Yes Deno Etienne, DO  chlorpheniramine-HYDROcodone (TUSSIONEX PENNKINETIC ER) 10-8 MG/5ML SUER Take 5 mLs by mouth every 12 (twelve) hours as needed for cough. 03/04/21  Yes Dorie Rank, MD  losartan (COZAAR) 25 MG tablet Take 25 mg by mouth daily.   Yes [provider]  LOVASTATIN PO Take by mouth.   Yes [provider]  TESTOSTERONE TD Place onto the skin.    [provider]    Allergies    Patient has no known allergies.  Review of Systems   Review of Systems  All other systems reviewed and are negative.   Physical Exam Updated Vital Signs BP (!) 160/109 (BP Location: Right Arm)   Pulse 89   Temp 98.2 F (36.8 C) (Oral)   Resp 18   Ht 1.829 m (6')   Wt 131.5 kg   SpO2 95%   BMI 39.33 kg/m   Physical Exam Vitals and nursing note reviewed.  Constitutional:      General: He is not in acute distress.    Appearance: He is well-developed.  HENT:     Head: Normocephalic and atraumatic.     Right Ear: External ear  normal.     Left Ear: External ear normal.  Eyes:     General: No scleral icterus.       Right eye: No discharge.        Left eye: No discharge.     Conjunctiva/sclera: Conjunctivae normal.  Neck:     Trachea: No tracheal deviation.  Cardiovascular:     Rate and Rhythm: Normal rate and regular rhythm.  Pulmonary:     Effort: Pulmonary effort is normal. No respiratory distress.     Breath sounds: Normal breath sounds. No stridor. No wheezing or rales.     Comments: Mild tenderness anteriorly bilateral lower rib margins Abdominal:     General: Bowel sounds are normal. There is no distension.     Palpations: Abdomen is soft.     Tenderness: There is no abdominal tenderness. There is no guarding or rebound.     Comments: No hernia appreciated  Musculoskeletal:        General: No tenderness.     Cervical  back: Neck supple.  Skin:    General: Skin is warm and dry.     Findings: No rash.  Neurological:     Mental Status: He is alert.     Cranial Nerves: No cranial nerve deficit (no facial droop, extraocular movements intact, no slurred speech).     Sensory: No sensory deficit.     Motor: No abnormal muscle tone or seizure activity.     Coordination: Coordination normal.     ED Results / Procedures / Treatments   Labs (all labs ordered are listed, but only abnormal results are displayed) Labs Reviewed - No data to display  EKG None  Radiology DG Chest 2 View  Result Date: 03/04/2021 CLINICAL DATA:  Cough and congestion. EXAM: CHEST - 2 VIEW COMPARISON:  02/27/2021 FINDINGS: Mild elevation of the right hemidiaphragm is unchanged. Slightly low lung volumes are unchanged. Mild crowding of the central vasculature is unchanged. No focal airspace disease or overt pulmonary edema. Heart and mediastinum are stable. No significant pleural fluid on the lateral view. No acute bone abnormality. IMPRESSION: No active cardiopulmonary disease. Electronically Signed   By: Markus Daft M.D.   On: 03/04/2021 10:36    Procedures Procedures   Medications Ordered in ED Medications - No data to display  ED Course  I have reviewed the triage vital signs and the nursing notes.  Pertinent labs & imaging results that were available during my care of the patient were reviewed by me and considered in my medical decision making (see chart for details).    MDM Rules/Calculators/A&P                          Patient's chest x-ray does not show pneumonia.  Patient otherwise appears well.  Have not observed any coughing while I was in the room with him.  We will give a prescription for alternative cough medication.  Recommend outpatient follow-up with primary care doctor. Final Clinical Impression(s) / ED Diagnoses Final diagnoses:  Viral URI with cough    Rx / DC Orders ED Discharge Orders         Ordered     chlorpheniramine-HYDROcodone (TUSSIONEX PENNKINETIC ER) 10-8 MG/5ML SUER  Every 12 hours PRN        03/04/21 1108           Dorie Rank, MD 03/04/21 1109

## 2021-03-05 LAB — CULTURE, BLOOD (ROUTINE X 2)
Culture: NO GROWTH
Culture: NO GROWTH
Specimen Description: ADEQUATE
Specimen Description: ADEQUATE

## 2021-05-14 ENCOUNTER — Encounter: Admit: 2021-05-14 | Payer: PRIVATE HEALTH INSURANCE | Attending: Cardiovascular Disease | Primary: Urology

## 2021-05-14 NOTE — Telephone Encounter
This pt is living in [place] and has not seen you in person since 2019. Every OV since than has been canceled or no show.

## 2021-05-14 NOTE — Telephone Encounter
Losartan 50mg  dailyLovastatin 10mg  [facility] [address], [place], [place] last seen by [employer] via video visits in 2020

## 2021-05-14 NOTE — Telephone Encounter
Pt is living in [facility] and has not been seen in person since 2019. Pt has canceled and no showed every OV since. I spoke to the pharmacist in [place] and he does have an MD there where he lives.

## 2021-05-21 NOTE — Telephone Encounter
S/W patient - scheduled fuv with JG 12/04/2021

## 2021-05-21 NOTE — Telephone Encounter
Spencer Nelson should get his refills from his doctor in [place].  We can re-engage if he so desires.[name] MD [facility] RPVIYale Medical GroupClinician/Associate Professor of Clinical Medicine

## 2021-05-21 NOTE — Telephone Encounter
Spoke to patient and relayed MD message. Patient states he only has a urologist in [place].He would like to set up either zoom or in person meeting for F/U visit with JG. He is willing to come to [place] of OV.He ran out of Losartan for 30 days now.

## 2021-05-22 MED ORDER — LOSARTAN 50 MG TABLET
50 mg | ORAL_TABLET | Freq: Every day | ORAL | 1 refills | Status: AC
Start: 2021-05-22 — End: 2021-08-26

## 2021-07-06 ENCOUNTER — Encounter: Admit: 2021-07-06 | Payer: PRIVATE HEALTH INSURANCE | Attending: Cardiovascular Disease | Primary: Urology

## 2021-08-21 ENCOUNTER — Encounter: Admit: 2021-08-21 | Payer: PRIVATE HEALTH INSURANCE | Attending: Cardiovascular Disease | Primary: Urology

## 2021-08-26 MED ORDER — LOSARTAN 50 MG TABLET
50 mg | ORAL_TABLET | 4 refills | Status: AC
Start: 2021-08-26 — End: 2022-10-07

## 2021-12-04 ENCOUNTER — Ambulatory Visit: Admit: 2021-12-04 | Payer: PRIVATE HEALTH INSURANCE | Attending: Cardiovascular Disease | Primary: Urology

## 2021-12-04 ENCOUNTER — Other Ambulatory Visit: Payer: Self-pay

## 2021-12-04 ENCOUNTER — Encounter (HOSPITAL_BASED_OUTPATIENT_CLINIC_OR_DEPARTMENT_OTHER): Payer: Self-pay

## 2021-12-04 ENCOUNTER — Emergency Department (HOSPITAL_BASED_OUTPATIENT_CLINIC_OR_DEPARTMENT_OTHER)
Admission: EM | Admit: 2021-12-04 | Discharge: 2021-12-04 | Disposition: A | Discharge status: Home / Self Care | Payer: BC Managed Care – PPO | Attending: Emergency Medicine | Admitting: Emergency Medicine

## 2021-12-04 ENCOUNTER — Emergency Department (HOSPITAL_BASED_OUTPATIENT_CLINIC_OR_DEPARTMENT_OTHER): Payer: BC Managed Care – PPO

## 2021-12-04 DIAGNOSIS — M25511 Pain in right shoulder: Secondary | ICD-10-CM | POA: Insufficient documentation

## 2021-12-04 DIAGNOSIS — Z7982 Long term (current) use of aspirin: Secondary | ICD-10-CM | POA: Diagnosis not present

## 2021-12-04 MED ORDER — HYDROCODONE-ACETAMINOPHEN 5-325 MG PO TABS: 2.0000 | ORAL_TABLET | Freq: Four times a day (QID) | ORAL | 0 refills | Status: DC | PRN

## 2021-12-04 MED ORDER — PREDNISONE 20 MG PO TABS
ORAL_TABLET | ORAL | 0 refills | Status: DC
Start: 2021-12-04 — End: 2022-05-28

## 2021-12-04 NOTE — ED Triage Notes (Signed)
Pt reports chronic right shoulder pain that has worsened over the past 2 weeks. Denies any recent injury

## 2021-12-04 NOTE — ED Provider Notes (Signed)
[facility] Provider Note   CSN: [id number] Arrival date & time: 12/04/21  1001     History  Chief Complaint  Patient presents with   Shoulder Pain    John Hammond is a 66 y.o. male.  Patient is a 66 year old male who presents with right shoulder pain.  He reports he has some chronic pain in his right shoulder but it is gotten worse over the last 2 weeks.  He says it hurts with certain positions particularly when it is just hanging down and at times when he has his arm raise, he will get some numbness down toward his hand.  No persistent numbness.  He does not have any definite weakness in the arm but he says he feels like when he is at the gym his right grip strength is a little bit less than his left.  He denies any recent injuries.  No associated neck pain.  He has never been evaluated for his shoulder.      Home Medications Prior to Admission medications   Medication Sig Start Date End Date Taking? Authorizing Provider  HYDROcodone-acetaminophen (NORCO/VICODIN) 5-325 MG tablet Take 2 tablets by mouth every 6 (six) hours as needed. 12/04/21  Yes Malvin Johns, MD  predniSONE (DELTASONE) 20 MG tablet 3 tabs po day one, then 2 po daily x 4 days 12/04/21  Yes Malvin Johns, MD  aspirin 81 MG tablet Take 81 mg by mouth daily.    [provider]  benzonatate (TESSALON) 100 MG capsule Take 1 capsule (100 mg total) by mouth every 8 (eight) hours. 02/28/21   Deno Etienne, DO  chlorpheniramine-HYDROcodone (TUSSIONEX PENNKINETIC ER) 10-8 MG/5ML SUER Take 5 mLs by mouth every 12 (twelve) hours as needed for cough. 03/04/21   Dorie Rank, MD  losartan (COZAAR) 25 MG tablet Take 25 mg by mouth daily.    [provider]  LOVASTATIN PO Take by mouth.    [provider]  TESTOSTERONE TD Place onto the skin.    [provider]      Allergies    Patient has no known allergies.    Review of Systems   Review of Systems  Constitutional:   Negative for fever.  Gastrointestinal:  Negative for nausea and vomiting.  Musculoskeletal:  Positive for arthralgias. Negative for back pain, joint swelling and neck pain.  Skin:  Negative for wound.  Neurological:  Negative for weakness, numbness and headaches.   Physical Exam Updated Vital Signs BP (!) 136/94    Pulse 78    Temp 97.7 F (36.5 C) (Oral)    Resp 19    Ht 5\' 11"  (1.803 m)    Wt 120.2 kg    SpO2 97%    BMI 36.96 kg/m  Physical Exam Constitutional:      Appearance: He is well-developed.  HENT:     Head: Normocephalic and atraumatic.  Cardiovascular:     Rate and Rhythm: Normal rate.  Pulmonary:     Effort: Pulmonary effort is normal.  Musculoskeletal:        General: Tenderness present.     Cervical back: Normal range of motion and neck supple.     Comments: Positive tenderness on palpation of the shoulder, primarily on the anterior aspect of the shoulder.  There are some pain with AB duction of the shoulder.  No significant pain on external rotation.  No swelling or deformity noted.  He has normal sensation and motor function to his arm.  Perfusion appears normal with good radial pulses.  Skin:    General: Skin is warm and dry.  Neurological:     Mental Status: He is alert and oriented to person, place, and time.    ED Results / Procedures / Treatments   Labs (all labs ordered are listed, but only abnormal results are displayed) Labs Reviewed - No data to display  EKG None  Radiology DG Shoulder Right  Result Date: 12/04/2021 CLINICAL DATA:  shoulder pain EXAM: RIGHT SHOULDER - 2+ VIEW COMPARISON:  None. FINDINGS: Normal alignment. No acute fracture. 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 Normal mineralization. The soft tissues are unremarkable. IMPRESSION: No malalignment or acute fracture. 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 Electronically Signed   By: Albin Felling M.D.   On: 12/04/2021 10:59    Procedures Procedures    Medications  Ordered in ED Medications - No data to display  ED Course/ Medical Decision Making/ A&P                           Medical Decision Making  Patient presents with exacerbation of his right shoulder pain.  No recent injuries.  No neurologic deficits.  X-rays revealed some degenerative changes of the [facility] joint but otherwise nonconcerning.  These were interpreted by me as well.  He was discharged home in good condition.  He was given a prescription for short course of Vicodin and prednisone.  He was discussed the use of Voltaren gel.  We will give him a referral for outpatient follow-up.  Return precautions given.  Final Clinical Impression(s) / ED Diagnoses Final diagnoses:  Acute pain of right shoulder    Rx / DC Orders ED Discharge Orders          Ordered    HYDROcodone-acetaminophen (NORCO/VICODIN) 5-325 MG tablet  Every 6 hours PRN        12/04/21 1136    predniSONE (DELTASONE) 20 MG tablet        12/04/21 1136              Malvin Johns, MD 12/04/21 1137

## 2021-12-07 ENCOUNTER — Ambulatory Visit: Payer: Self-pay

## 2021-12-07 ENCOUNTER — Ambulatory Visit (INDEPENDENT_AMBULATORY_CARE_PROVIDER_SITE_OTHER): Payer: BC Managed Care – PPO | Admitting: Family Medicine

## 2021-12-07 VITALS — BP 147/91 | Ht 71.0 in | Wt 260.0 lb

## 2021-12-07 DIAGNOSIS — M25552 Pain in left hip: Secondary | ICD-10-CM | POA: Insufficient documentation

## 2021-12-07 DIAGNOSIS — M25511 Pain in right shoulder: Secondary | ICD-10-CM

## 2021-12-07 DIAGNOSIS — M7551 Bursitis of right shoulder: Secondary | ICD-10-CM | POA: Diagnosis not present

## 2021-12-07 NOTE — Assessment & Plan Note (Signed)
Pain seems more associated with the bursitis as opposed degenerative changes of the [facility] joint. -Counseled on home exercise therapy supportive care. -Counseled on prednisone. -Could consider injection or physical therapy.

## 2021-12-07 NOTE — Progress Notes (Signed)
°  John Hammond - 66 y.o. male MRN [id number]  Date of birth: Jun 04, 1956  SUBJECTIVE:  Including CC & ROS.  No chief complaint on file.   John Hammond is a 66 y.o. male that is presenting with acute on chronic right shoulder pain and acute left hip pain.  Shoulder pain is causing him pain with sleeping on the affected side.  Intermittent in nature.  He continues to workout.  Left hip pain is on the lateral aspect.  Review of the emergency department note from 1/6 shows he was provided Norco with prednisone.  Independent review of the right shoulder x-ray from 1/6 shows no acute changes.  Review of left hip x-ray from 2017 shows degenerative changes with subchondral sclerosis and osteophyte formation.  Review of Systems See HPI   HISTORY: Past Medical, Surgical, Social, and Family History Reviewed & Updated per EMR.   Pertinent Historical Findings include:  Past Medical History:  Diagnosis Date   Hypertension    Renal disorder     Past Surgical History:  Procedure Laterality Date   kidney stones     LITHOTRIPSY       PHYSICAL EXAM:  VS: BP (!) 147/91    Ht 5\' 11"  (1.803 m)    Wt 260 lb (117.9 kg)    BMI 36.26 kg/m  Physical Exam Gen: NAD, alert, cooperative with exam, well-appearing MSK:  Neurovascularly intact    Limited ultrasound: Right shoulder:  Mild degenerative changes appreciated the biceps tendon. Overlying bursitis of the subscapularis. Subacromial bursitis appreciated. Degenerative changes of the [facility] joint. No significant change in posterior glenohumeral joint.  Summary: Degenerative AC joint and bursitis appreciated  Ultrasound and interpretation by Clearance Coots, MD    ASSESSMENT & PLAN:   Subacromial bursitis of right shoulder joint Pain seems more associated with the bursitis as opposed degenerative changes of the [facility] joint. -Counseled on home exercise therapy supportive care. -Counseled on prednisone. -Could consider injection or physical  therapy.  Greater trochanteric pain syndrome of left lower extremity Pain is more lateral but does have previous degenerative changes appreciated on imaging. -Counseled on home exercise therapy and supportive care. -Counseled on prednisone. -Could consider injection if needed.

## 2021-12-07 NOTE — Assessment & Plan Note (Signed)
Pain is more lateral but does have previous degenerative changes appreciated on imaging. -Counseled on home exercise therapy and supportive care. -Counseled on prednisone. -Could consider injection if needed.

## 2021-12-07 NOTE — Patient Instructions (Signed)
Nice to meet you Please try heat before exercise and ice after  Please try the exercises   Please send me a message in [place] with any questions or updates.  Please see me back in 2-3 weeks.   --Dr. Raeford Razor

## 2021-12-28 ENCOUNTER — Ambulatory Visit: Payer: BC Managed Care – PPO | Admitting: Family Medicine

## 2022-01-12 ENCOUNTER — Ambulatory Visit: Payer: BC Managed Care – PPO | Admitting: Family Medicine

## 2022-01-14 ENCOUNTER — Ambulatory Visit: Payer: BC Managed Care – PPO | Admitting: Family Medicine

## 2022-01-14 ENCOUNTER — Ambulatory Visit: Payer: Self-pay

## 2022-01-14 ENCOUNTER — Encounter: Payer: Self-pay | Admitting: Family Medicine

## 2022-01-14 VITALS — BP 134/82 | Ht 71.0 in | Wt 260.0 lb

## 2022-01-14 DIAGNOSIS — S43431D Superior glenoid labrum lesion of right shoulder, subsequent encounter: Secondary | ICD-10-CM

## 2022-01-14 MED ORDER — TRIAMCINOLONE ACETONIDE 40 MG/ML IJ SUSP
40.0000 mg | Freq: Once | INTRAMUSCULAR | Status: AC
  Administered 2022-01-14: 40 mg via INTRA_ARTICULAR

## 2022-01-14 NOTE — Assessment & Plan Note (Signed)
Acute on chronic in nature.  Symptoms seem more consistent with labral origin.  Has been under 6 weeks of physician directed home exercise therapy.  Previous x-ray was normal. -Counseled on home exercise therapy and supportive care. -Injection today. -MRI of the right shoulder to evaluate for a labral tear and for presurgical planning.

## 2022-01-14 NOTE — Progress Notes (Signed)
°  John Hammond - 66 y.o. male MRN [id number]  Date of birth: August 01, 1956  SUBJECTIVE:  Including CC & ROS.  No chief complaint on file.   John Hammond is a 66 y.o. male that is presenting with worsening of his right shoulder pain.  He is having pain when he crosses his body or if he lies on the affected side.  Pain is been ongoing for a few months.    Review of Systems See HPI   HISTORY: Past Medical, Surgical, Social, and Family History Reviewed & Updated per EMR.   Pertinent Historical Findings include:  Past Medical History:  Diagnosis Date   Hypertension    Renal disorder     Past Surgical History:  Procedure Laterality Date   kidney stones     LITHOTRIPSY       PHYSICAL EXAM:  VS: BP 134/82 (BP Location: Left Arm, Patient Position: Sitting)    Ht 5\' 11"  (1.803 m)    Wt 260 lb (117.9 kg)    BMI 36.26 kg/m  Physical Exam Gen: NAD, alert, cooperative with exam, well-appearing MSK:  Right shoulder: Limited external rotation. Pain with flexion. Positive O'Brien's test. Neurovascularly intact     Aspiration/Injection Procedure Note John Hammond 05-Mar-1956  Procedure: Injection Indications: Right shoulder pain  Procedure Details Consent: Risks of procedure as well as the alternatives and risks of each were explained to the (patient/caregiver).  Consent for procedure obtained. Time Out: Verified patient identification, verified procedure, site/side was marked, verified correct patient position, special equipment/implants available, medications/allergies/relevent history reviewed, required imaging and test results available.  Performed.  The area was cleaned with iodine and alcohol swabs.    The right glenohumeral joint was injected using 3 cc of 1% lidocaine on a 22-gauge 3-1/2 inch needle.  The syringe was switched to mixture containing 1 cc's of 40 mg Kenalog and 4 cc's of 0.25% bupivacaine was injected.  Ultrasound was used. Images were obtained in short views  showing the injection.     A sterile dressing was applied.  Patient did tolerate procedure well.     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  Symptoms seem more consistent with labral origin.  Has been under 6 weeks of physician directed home exercise therapy.  Previous x-ray was normal. -Counseled on home exercise therapy and supportive care. -Injection today. -MRI of the right shoulder to evaluate for a labral tear and for presurgical planning.

## 2022-01-14 NOTE — Patient Instructions (Signed)
Good to see you Please use ice as needed  Please call [phone] to schedule the MRi   Please send me a message in MyChart with any questions or updates.  We'll set up a virtual visit once the MRI is resulted.   --Dr. Raeford Razor

## 2022-01-15 ENCOUNTER — Ambulatory Visit: Payer: BC Managed Care – PPO | Admitting: Family Medicine

## 2022-01-29 ENCOUNTER — Other Ambulatory Visit: Payer: BC Managed Care – PPO

## 2022-02-10 ENCOUNTER — Inpatient Hospital Stay: Admission: RE | Admit: 2022-02-10 | Payer: BC Managed Care – PPO | Source: Ambulatory Visit

## 2022-02-25 ENCOUNTER — Ambulatory Visit: Payer: Self-pay

## 2022-02-25 ENCOUNTER — Encounter: Payer: Self-pay | Admitting: Family Medicine

## 2022-02-25 ENCOUNTER — Ambulatory Visit (INDEPENDENT_AMBULATORY_CARE_PROVIDER_SITE_OTHER): Payer: BC Managed Care – PPO | Admitting: Family Medicine

## 2022-02-25 VITALS — BP 100/70 | Ht 71.0 in | Wt 260.0 lb

## 2022-02-25 DIAGNOSIS — S43431D Superior glenoid labrum lesion of right shoulder, subsequent encounter: Secondary | ICD-10-CM

## 2022-02-25 MED ORDER — KETOROLAC TROMETHAMINE 30 MG/ML IJ SOLN
30.0000 mg | Freq: Once | INTRAMUSCULAR | Status: AC
  Administered 2022-02-25: 30 mg via INTRA_ARTICULAR

## 2022-02-25 NOTE — Assessment & Plan Note (Signed)
Acutely worsening.  Continuing to have pain that is more joint related.  Did get improvement with the previous intra-articular injection. ?-Counseled on home exercise therapy and supportive care. ?-Injection today. ?-Continue to obtain MRI. ?-Could consider suprascapular nerve block. ?

## 2022-02-25 NOTE — Progress Notes (Signed)
?  John Hammond - 66 y.o. male MRN [id number]  Date of birth: June 02, 1956 ? ?SUBJECTIVE:  Including CC & ROS.  ?No chief complaint on file. ? ? ?John Hammond is a 65 y.o. male that is presenting with acute worsening of his right shoulder pain.  Initially got improvement with the previous injection but the pain has returned.  He has been unable to get his MRI completed as of late. ? ? ? ?Review of Systems ?See HPI  ? ?HISTORY: Past Medical, Surgical, Social, and Family History Reviewed & Updated per EMR.   ?Pertinent Historical Findings include: ? ?Past Medical History:  ?Diagnosis Date  ? Hypertension   ? Renal disorder   ? ? ?Past Surgical History:  ?Procedure Laterality Date  ? kidney stones    ? LITHOTRIPSY    ? ? ? ?PHYSICAL EXAM:  ?VS: BP 100/70 (BP Location: Left Arm, Patient Position: Sitting)   Ht '5\' 11"'$  (1.803 m)   Wt 260 lb (117.9 kg)   BMI 36.26 kg/m?  ?Physical Exam ?Gen: NAD, alert, cooperative with exam, well-appearing ?MSK:  ?Neurovascularly intact   ? ? ?Aspiration/Injection Procedure Note ?Claretta Fraise ?May 06, 1956 ? ?Procedure: Injection ?Indications: Right shoulder pain ? ?Procedure Details ?Consent: Risks of procedure as well as the alternatives and risks of each were explained to the (patient/caregiver).  Consent for procedure obtained. ?Time Out: Verified patient identification, verified procedure, site/side was marked, verified correct patient position, special equipment/implants available, medications/allergies/relevent history reviewed, required imaging and test results available.  Performed.  The area was cleaned with iodine and alcohol swabs.   ? ?The right glenohumeral joint was injected using 3 cc of 1% lidocaine on a 22-gauge 3-1/2 inch needle.  The syringe was switched and a mixture containing 1 cc's of 30 mg Toradol and 4 cc's of 0.25% bupivacaine was injected.  Ultrasound was used. Images were obtained in short views showing the injection.   ? ? ?A sterile dressing was  applied. ? ?Patient did tolerate procedure well. ? ? ? ? ?ASSESSMENT & PLAN:  ? ?Labral tear of shoulder, right, subsequent encounter ?Acutely worsening.  Continuing to have pain that is more joint related.  Did get improvement with the previous intra-articular injection. ?-Counseled on home exercise therapy and supportive care. ?-Injection today. ?-Continue to obtain MRI. ?-Could consider suprascapular nerve block. ? ? ? ? ?

## 2022-02-25 NOTE — Patient Instructions (Addendum)
Good to see you ?Please use ice as needed   ?Please send me a message in MyChart with any questions or updates.  ?We'll setup a virtual visit once the MRi is resulted.  ? ?Located in: [employer] ?Address: [address], [place], [place] [address] ?Phone: [phone] ? ? ?--Dr. Raeford Razor ? ?

## 2022-03-24 ENCOUNTER — Telehealth: Payer: Self-pay | Admitting: Family Medicine

## 2022-03-24 NOTE — Telephone Encounter (Signed)
Patient cld req'd Rx for pain med due to ongoing shoulder pain ,states will be [place] for 10[place]14hr out of country & ask provider for Rx for pain. ? ?--Forwarding request to provider ?

## 2022-03-25 ENCOUNTER — Other Ambulatory Visit (HOSPITAL_BASED_OUTPATIENT_CLINIC_OR_DEPARTMENT_OTHER): Payer: Self-pay

## 2022-03-25 MED ORDER — HYDROCODONE-ACETAMINOPHEN 5-325 MG PO TABS
2.0000 | ORAL_TABLET | Freq: Four times a day (QID) | ORAL | 0 refills | Status: DC | PRN
  Filled 2022-03-25: qty 12, 2d supply, fill #0

## 2022-03-25 NOTE — Telephone Encounter (Signed)
Provide norco for pain.  ? ?Rosemarie Ax, MD ?[facility] ?03/25/2022, 3:11 PM ? ?

## 2022-03-25 NOTE — Telephone Encounter (Signed)
Left VM for patient. If he calls back please have him speak with a nurse/CMA and ask what pain medicine has worked for him in the past.  ? ?If any questions then please take the best time and phone number to call and I will try to call him back.  ? ?Rosemarie Ax, MD ?[facility] ?03/25/2022, 1:01 PM ? ? ?

## 2022-03-25 NOTE — Telephone Encounter (Signed)
Forwarding message to med asst --Pt Returned provider's call . ?-glh ?

## 2022-03-25 NOTE — Telephone Encounter (Signed)
I spoke to pt- he states either hydroco/apap or oxycod/apap to be sent to [place].  ?

## 2022-03-31 ENCOUNTER — Other Ambulatory Visit (HOSPITAL_BASED_OUTPATIENT_CLINIC_OR_DEPARTMENT_OTHER): Payer: Self-pay

## 2022-04-21 ENCOUNTER — Ambulatory Visit: Admit: 2022-04-21 | Payer: MEDICARE | Attending: Cardiovascular Disease | Primary: Urology

## 2022-05-03 ENCOUNTER — Ambulatory Visit: Payer: Medicare Other | Admitting: Podiatry

## 2022-05-03 ENCOUNTER — Encounter: Payer: Self-pay | Admitting: *Deleted

## 2022-05-17 ENCOUNTER — Ambulatory Visit: Payer: Medicare Other | Admitting: Podiatry

## 2022-05-28 ENCOUNTER — Ambulatory Visit (INDEPENDENT_AMBULATORY_CARE_PROVIDER_SITE_OTHER): Payer: Medicare Other | Admitting: Family Medicine

## 2022-05-28 ENCOUNTER — Ambulatory Visit: Payer: Medicare Other

## 2022-05-28 ENCOUNTER — Encounter: Payer: Self-pay | Admitting: Family Medicine

## 2022-05-28 ENCOUNTER — Ambulatory Visit: Payer: Self-pay

## 2022-05-28 ENCOUNTER — Ambulatory Visit (INDEPENDENT_AMBULATORY_CARE_PROVIDER_SITE_OTHER): Payer: Medicare Other | Admitting: Podiatry

## 2022-05-28 VITALS — BP 110/82 | Ht 71.0 in | Wt 250.0 lb

## 2022-05-28 DIAGNOSIS — Z79899 Other long term (current) drug therapy: Secondary | ICD-10-CM

## 2022-05-28 DIAGNOSIS — B351 Tinea unguium: Secondary | ICD-10-CM | POA: Diagnosis not present

## 2022-05-28 DIAGNOSIS — S43431D Superior glenoid labrum lesion of right shoulder, subsequent encounter: Secondary | ICD-10-CM

## 2022-05-28 DIAGNOSIS — M7989 Other specified soft tissue disorders: Secondary | ICD-10-CM

## 2022-05-28 MED ORDER — TRIAMCINOLONE ACETONIDE 32 MG IX SRER
32.0000 mg | Freq: Once | INTRA_ARTICULAR | Status: AC
  Administered 2022-05-28: 32 mg via INTRA_ARTICULAR

## 2022-05-28 NOTE — Assessment & Plan Note (Signed)
Acute on chronic in nature.  Having pain that is similar to his labral pain that he experienced in the past. -Counseled on home exercise therapy and supportive care. -Zilretta injection today. -Could consider further imaging.

## 2022-05-28 NOTE — Progress Notes (Signed)
  John Hammond - 66 y.o. male MRN [id number]  Date of birth: 08/30/1956  SUBJECTIVE:  Including CC & ROS.  No chief complaint on file.   John Hammond is a 66 y.o. male that is presenting with acute right shoulder pain.  His pain is flaring up.  Has had similar pain in the past.  No recent injury inciting event..    Review of Systems See HPI   HISTORY: Past Medical, Surgical, Social, and Family History Reviewed & Updated per EMR.   Pertinent Historical Findings include:  Past Medical History:  Diagnosis Date   Hypertension    Renal disorder     Past Surgical History:  Procedure Laterality Date   kidney stones     LITHOTRIPSY       PHYSICAL EXAM:  VS: BP 110/82 (BP Location: Left Arm, Patient Position: Sitting)   Ht '5\' 11"'$  (1.803 m)   Wt 250 lb (113.4 kg)   BMI 34.87 kg/m  Physical Exam Gen: NAD, alert, cooperative with exam, well-appearing MSK:  Neurovascularly intact     Aspiration/Injection Procedure Note John Hammond 1956/08/20  Procedure: Injection Indications: Right shoulder pain  Procedure Details Consent: Risks of procedure as well as the alternatives and risks of each were explained to the (patient/caregiver).  Consent for procedure obtained. Time Out: Verified patient identification, verStoped procedure, site/side was marked, verified correct patient position, special equipment/implants available, medications/allergies/relevent history reviewed, required imaging and test results available.  Performed.  The area was cleaned with iodine and alcohol swabs.    The right glenohumeral joint was injected using 3 cc of 1% lidocaine on a 22-gauge 3-1/2 inch needle.  The syringe was switched and a mixture containing 5 cc's of 32 mg Zilretta and 4 cc's of 0.25% bupivacaine was injected.  Ultrasound was used. Images were obtained in long views showing the injection.    A sterile dressing was applied.  Patient did tolerate procedure well.    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  Having pain that is similar to his labral pain that he experienced in the past. -Counseled on home exercise therapy and supportive care. -Zilretta injection today. -Could consider further imaging.

## 2022-05-28 NOTE — Patient Instructions (Signed)
Terbinafine Tablets What is this medication? TERBINAFINE (TER bin a feen) treats fungal infections of the nails. It belongs to a group of medications called antifungals. It will not treat infections caused by bacteria or viruses. This medicine may be used for other purposes; ask your health care provider or pharmacist if you have questions. COMMON BRAND NAME(S): Lamisil, Terbinex What should I tell my care team before I take this medication? They need to know if you have any of these conditions: Liver disease An unusual or allergic reaction to terbinafine, other medications, foods, dyes, or preservatives Pregnant or trying to get pregnant Breast-feeding How should I use this medication? Take this medication by mouth with water. Take it as directed on the prescription label at the same time every day. You can take it with or without food. If it upsets your stomach, take it with food. Keep taking it unless your care team tells you to stop. A special MedGuide will be given to you by the pharmacist with each prescription and refill. Be sure to read this information carefully each time. Talk to your care team regarding the use of this medication in children. Special care may be needed. Overdosage: If you think you have taken too much of this medicine contact a poison control center or emergency room at once. NOTE: This medicine is only for you. Do not share this medicine with others. What if I miss a dose? If you miss a dose, take it as soon as you can unless it is more than 4 hours late. If it is more than 4 hours late, skip the missed dose. Take the next dose at the normal time. What may interact with this medication? Do not take this medication with any of the following: Pimozide Thioridazine This medication may also interact with the following: Beta blockers Caffeine Certain medications for mental health conditions Cimetidine Cyclosporine Medications for fungal infections like fluconazole  and ketoconazole Medications for irregular heartbeat like amiodarone, flecainide and propafenone Rifampin Warfarin This list may not describe all possible interactions. Give your health care provider a list of all the medicines, herbs, non-prescription drugs, or dietary supplements you use. Also tell them if you smoke, drink alcohol, or use illegal drugs. Some items may interact with your medicine. What should I watch for while using this medication? Visit your care team for regular checks on your progress. You may need blood work while you are taking this medication. It may be some time before you see the benefit from this medication. This medication may cause serious skin reactions. They can happen weeks to months after starting the medication. Contact your care team right away if you notice fevers or flu-like symptoms with a rash. The rash may be red or purple and then turn into blisters or peeling of the skin. Or, you might notice a red rash with swelling of the face, lips or lymph nodes in your neck or under your arms. This medication can make you more sensitive to the sun. Keep out of the sun, If you cannot avoid being in the sun, wear protective clothing and sunscreen. Do not use sun lamps or tanning beds/booths. What side effects may I notice from receiving this medication? Side effects that you should report to your care team as soon as possible: Allergic reactions--skin rash, itching, hives, swelling of the face, lips, tongue, or throat Change in sense of smell Change in taste Infection--fever, chills, cough, or sore throat Liver injury--right upper belly pain, loss of appetite, nausea,   light-colored stool, dark yellow or brown urine, yellowing skin or eyes, unusual weakness or fatigue Low red blood cell level--unusual weakness or fatigue, dizziness, headache, trouble breathing Lupus-like syndrome--joint pain, swelling, or stiffness, butterfly-shaped rash on the face, rashes that get worse  in the sun, fever, unusual weakness or fatigue Rash, fever, and swollen lymph nodes Redness, blistering, peeling, or loosening of the skin, including inside the mouth Unusual bruising or bleeding Worsening mood, feelings of depression Side effects that usually do not require medical attention (report to your care team if they continue or are bothersome): Diarrhea Gas Headache Nausea Stomach pain Upset stomach This list may not describe all possible side effects. Call your doctor for medical advice about side effects. You may report side effects to FDA at 1-800-FDA-1088. Where should I keep my medication? Keep out of the reach of children and pets. Store between 20 and 25 degrees C (68 and 77 degrees F). Protect from light. Get rid of any unused medication after the expiration date. To get rid of medications that are no longer needed or have expired: Take the medication to a medication take-back program. Check with your pharmacy or law enforcement to find a location. If you cannot return the medication, check the label or package insert to see if the medication should be thrown out in the garbage or flushed down the toilet. If you are not sure, ask your care team. If it is safe to put it in the trash, take the medication out of the container. Mix the medication with cat litter, dirt, coffee grounds, or other unwanted substance. Seal the mixture in a bag or container. Put it in the trash. NOTE: This sheet is a summary. It may not cover all possible information. If you have questions about this medicine, talk to your doctor, pharmacist, or health care provider.  2023 Elsevier/Gold Standard (2021-07-01 00:00:00)

## 2022-05-28 NOTE — Patient Instructions (Signed)
Good to see you Please use ice as needed  Please send me a message in MyChart with any questions or updates.  Please see me back as needed.   --Dr. Deen Deguia  

## 2022-05-29 NOTE — Progress Notes (Signed)
Subjective:   Patient ID: John Hammond, male   DOB: 66 y.o.   MRN: [id number]   HPI 66 year old male presents the office today for concerns of a painful soft tissue mass on the left hallux.  He states he previously saw Dr. Gershon Mussel and he had a steroid injection performed which was only helpful for a few days and was have the area removed as it is tender with pressure.  Also has concerns about nail fungus.  No pain in the nails no swelling redness or any drainage.  No other concerns.   Review of Systems  All other systems reviewed and are negative.  Past Medical History:  Diagnosis Date   Hypertension    Renal disorder     Past Surgical History:  Procedure Laterality Date   kidney stones     LITHOTRIPSY       Current Outpatient Medications:    aspirin 81 MG tablet, Take 81 mg by mouth daily., Disp: , Rfl:    losartan (COZAAR) 25 MG tablet, Take 25 mg by mouth daily., Disp: , Rfl:    LOVASTATIN PO, Take by mouth., Disp: , Rfl:   No Known Allergies         Objective:  Physical Exam  General: AAO x3, NAD  Dermatological: Nails are hypertrophic, dystrophic with yellow discoloration.  No edema, erythema or signs of infection of the toenail sites.  Vascular: Dorsalis Pedis artery and Posterior Tibial artery pedal pulses are 2/4 bilateral with immedate capillary fill time. There is no pain with calf compression, swelling, warmth, erythema.   Neruologic: Grossly intact via light touch bilateral.   Musculoskeletal: Mobile soft tissue mass present to dorsal aspect the right hallux with tenderness palpation.  No signs of infection.  No crepitation.  Muscular strength 5/5 in all groups tested bilateral.  Gait: Unassisted, Nonantalgic.       Assessment:   Soft tissue mass left hallux, onychomycosis     Plan:  -Treatment options discussed including all alternatives, risks, and complications -Etiology of symptoms were discussed -X-rays were obtained and reviewed with the  patient.  3 views of the left foot were obtained.  No calcifications or evidence of acute fracture. -Soft tissue mass we discussed aspiration versus surgical excision.  He wants to proceed with excision of this.  We discussed doing this in the office under local anesthesia versus the surgical center and he elects proceed doing this in the office under local.  We discussed the surgery as well as postoperative course. -The incision placement as well as the postoperative course was discussed with the patient. I discussed risks of the surgery which include, but not limited to, infection, bleeding, pain, swelling, need for further surgery, delayed or nonhealing, painful or ugly scar, numbness or sensation changes, recurrence, DVT/PE, loss of toe/foot. Patient understands these risks and wishes to proceed with surgery. The surgical consent was reviewed with the patient all 3 pages were signed. No promises or guarantees were given to the outcome of the procedure. All questions were answered to the best of my ability. Before the surgery the patient was encouraged to call the office if there is any further questions. The surgery will be performed in the office under local anesthesia on an outpatient basis. -For nail fungus we discussed different treatment options with oral, topical as well as alternative treatments.  Was proceed with oral Lamisil.  We will check a CBC and LFT prior to starting medication.    Trula Slade DPM

## 2022-06-04 ENCOUNTER — Other Ambulatory Visit: Payer: Self-pay | Admitting: Orthopedic Surgery

## 2022-06-15 ENCOUNTER — Encounter: Admit: 2022-06-15 | Payer: PRIVATE HEALTH INSURANCE | Primary: Urology

## 2022-06-15 DIAGNOSIS — N200 Calculus of kidney: Secondary | ICD-10-CM

## 2022-06-22 ENCOUNTER — Ambulatory Visit (INDEPENDENT_AMBULATORY_CARE_PROVIDER_SITE_OTHER): Payer: Medicare Other | Admitting: Podiatry

## 2022-06-22 ENCOUNTER — Encounter (HOSPITAL_BASED_OUTPATIENT_CLINIC_OR_DEPARTMENT_OTHER): Payer: Self-pay | Admitting: Orthopedic Surgery

## 2022-06-22 DIAGNOSIS — D2372 Other benign neoplasm of skin of left lower limb, including hip: Secondary | ICD-10-CM | POA: Diagnosis not present

## 2022-06-22 DIAGNOSIS — M7989 Other specified soft tissue disorders: Secondary | ICD-10-CM

## 2022-06-22 MED ORDER — IBUPROFEN 800 MG PO TABS: 800.0000 mg | ORAL_TABLET | Freq: Three times a day (TID) | ORAL | 0 refills | Status: DC | PRN

## 2022-06-22 MED ORDER — CEPHALEXIN 500 MG PO CAPS: 500.0000 mg | ORAL_CAPSULE | Freq: Three times a day (TID) | ORAL | 0 refills | Status: DC

## 2022-06-22 NOTE — Addendum Note (Signed)
Addended by: Isidore Moos A on: 06/22/2022 08:56 AM   Modules accepted: Orders

## 2022-06-22 NOTE — Progress Notes (Signed)
Subjective: 66 year old male presents the office today for surgical excision soft tissue mass in the left big toe.  He is still tender.  He is previous had multiple injections by another provider without any resolution.  He wants refill of.  Soft tissue mass recently removed.  No fevers or chills any other concerns.  Objective: AAO x3, NAD DP/PT pulses palpable bilaterally, CRT less than 3 seconds Firm mobile soft tissue mass present dorsal aspect the left hallux to 0.5 cm in diameter.  There is tenderness palpation of the lesion.  There is no edema or erythema.  No other areas of discomfort.  MMT 5/5. No pain with calf compression, swelling, warmth, erythema  Assessment: Soft tissue mass left foot, hallux  Plan: -All treatment options discussed with the patient including all alternatives, risks, complications.  -We again discussed the surgery as well as postoperative course.  He wishes to go ahead and proceed with surgery.  All alternatives, risks, complications were discussed.  No promises or guarantees were given as to after the procedure and all questions were answered the best my ability. -Patient was seen initially in the treatment room.  Mixture of 4 cc of lidocaine, Marcaine plain was infiltrated in a regional block fashion.  Once anesthetized he was brought back to the procedure room suite.  Well-padded pneumatic ankle tourniquet applied making sure to pad all bony prominences.  Left lower extremities and scrubbed, prepped, draped in normal sterile fashion.  Timeout was performed.  I made a linear incision on the area of the soft tissue mass.  The incision was made with a 15 blade scalpel the epidermis and the dermis.  I then utilized blunt dissection around the area of the soft tissue mass to remove this in total.  It was superficial to the extensor tendon and did not appear to be involving the tendon or the joint.  It measures approximate 0.6 cm in diameter.  This was sent to pathology.  I  irrigated the wound.  Hemostasis achieved.  A single 5-0 Monocryl suture was utilized for subcutaneous closure followed by nylon.  Betadine was applied followed by dry sterile dressing.  The tourniquet was released there was found to be an immediate cap refill time to the digit. -Surgical shoe for immobilization.  -Ibuprofen 800 mg as needed -Keflex -Patient encouraged to call the office with any questions, concerns, change in symptoms.   Trula Slade DPM

## 2022-06-24 ENCOUNTER — Other Ambulatory Visit: Payer: Self-pay

## 2022-06-24 ENCOUNTER — Encounter (HOSPITAL_BASED_OUTPATIENT_CLINIC_OR_DEPARTMENT_OTHER): Payer: Self-pay | Admitting: Orthopedic Surgery

## 2022-06-25 ENCOUNTER — Encounter (HOSPITAL_BASED_OUTPATIENT_CLINIC_OR_DEPARTMENT_OTHER)
Admission: RE | Admit: 2022-06-25 | Discharge: 2022-06-25 | Disposition: A | Discharge status: Home / Self Care | Payer: Medicare Other | Source: Ambulatory Visit | Attending: Orthopedic Surgery | Admitting: Orthopedic Surgery

## 2022-06-25 ENCOUNTER — Other Ambulatory Visit: Payer: Self-pay

## 2022-06-25 DIAGNOSIS — Z01810 Encounter for preprocedural cardiovascular examination: Secondary | ICD-10-CM | POA: Diagnosis present

## 2022-06-25 DIAGNOSIS — I10 Essential (primary) hypertension: Secondary | ICD-10-CM | POA: Diagnosis not present

## 2022-06-28 ENCOUNTER — Encounter: Payer: Medicare Other | Admitting: Podiatry

## 2022-06-28 ENCOUNTER — Encounter (HOSPITAL_BASED_OUTPATIENT_CLINIC_OR_DEPARTMENT_OTHER): Payer: Self-pay | Admitting: Orthopedic Surgery

## 2022-06-28 ENCOUNTER — Encounter (HOSPITAL_COMMUNITY): Payer: Self-pay | Admitting: Anesthesiology

## 2022-06-28 ENCOUNTER — Ambulatory Visit (HOSPITAL_BASED_OUTPATIENT_CLINIC_OR_DEPARTMENT_OTHER): Admission: RE | Admit: 2022-06-28 | Payer: Medicare Other | Source: Ambulatory Visit | Admitting: Orthopedic Surgery

## 2022-06-28 DIAGNOSIS — Z01818 Encounter for other preprocedural examination: Secondary | ICD-10-CM

## 2022-06-28 DIAGNOSIS — I10 Essential (primary) hypertension: Secondary | ICD-10-CM

## 2022-06-28 SURGERY — EXCISION MASS UPPER EXTREMITIES
Anesthesia: Choice | Laterality: Right

## 2022-06-28 NOTE — Anesthesia Preprocedure Evaluation (Deleted)
Anesthesia Evaluation    Reviewed: Allergy & Precautions, Patient's Chart, lab work & pertinent test results, Unable to perform ROS - Chart review only  Airway        Dental   Pulmonary neg pulmonary ROS,           Cardiovascular hypertension, Pt. on medications      Neuro/Psych negative neurological ROS     GI/Hepatic negative GI ROS, Neg liver ROS,   Endo/Other  negative endocrine ROS  Renal/GU Renal disease     Musculoskeletal negative musculoskeletal ROS (+)   Abdominal (+) + obese,   Peds  Hematology negative hematology ROS (+)   Anesthesia Other Findings   Reproductive/Obstetrics                             Anesthesia Physical Anesthesia Plan  ASA: 2  Anesthesia Plan: MAC and General   Post-op Pain Management: Tylenol PO (pre-op)*, Celebrex PO (pre-op)* and Minimal or no pain anticipated   Induction:   PONV Risk Score and Plan: 3 and Ondansetron, Dexamethasone, Treatment may vary due to age or medical condition and Midazolam  Airway Management Planned:   Additional Equipment:   Intra-op Plan:   Post-operative Plan:   Informed Consent:   Plan Discussed with:   Anesthesia Plan Comments:         Anesthesia Quick Evaluation

## 2022-06-30 ENCOUNTER — Telehealth: Admit: 2022-06-30 | Payer: PRIVATE HEALTH INSURANCE | Attending: Cardiovascular Disease | Primary: Urology

## 2022-06-30 NOTE — Telephone Encounter
Former pt of Dr. Rush Barer. Wants recommendations to est with another cardiologists.

## 2022-06-30 NOTE — Telephone Encounter
Patient would like to re-establish care with Cardiologist.Moving back to [place].Given secretary's number/

## 2022-07-01 ENCOUNTER — Encounter: Payer: Self-pay | Admitting: Podiatry

## 2022-07-08 ENCOUNTER — Ambulatory Visit (INDEPENDENT_AMBULATORY_CARE_PROVIDER_SITE_OTHER): Payer: Medicare Other | Admitting: Podiatry

## 2022-07-08 DIAGNOSIS — M7989 Other specified soft tissue disorders: Secondary | ICD-10-CM

## 2022-07-08 DIAGNOSIS — D219 Benign neoplasm of connective and other soft tissue, unspecified: Secondary | ICD-10-CM

## 2022-07-10 DIAGNOSIS — D219 Benign neoplasm of connective and other soft tissue, unspecified: Secondary | ICD-10-CM | POA: Insufficient documentation

## 2022-07-10 NOTE — Progress Notes (Signed)
Subjective:  Chief Complaint  Patient presents with   Routine Post Op    LT BIG TOE REMOVAL OF SOFT TISSUE MASS. Sutures removed. Pt states he is doing wonderful no complains healing well.     John Hammond is a 66 y.o. is seen today in office s/p left foot hallux soft tissue mass excision preformed on 06/22/2022.  He states he is doing well not having any pain.  The pain that he was having prior to the surgery has resolved.  Presents today for suture removal.  Denies any fevers or chills.  He has no other concerns today.    Objective: General: No acute distress, AAOx3 - wearing regular shoes DP/PT pulses palpable 2/4, CRT < 3 sec to all digits.  Protective sensation intact. Motor function intact.  Left foot: Incision is well coapted without any evidence of dehiscence with sutures intact. There is no surrounding erythema, ascending cellulitis, fluctuance, crepitus, malodor, drainage/purulence. There is no significant edema around the surgical site. There is no pain along the surgical site.  No other areas of tenderness to bilateral lower extremities.  No other open lesions or pre-ulcerative lesions.  No pain with calf compression, swelling, warmth, erythema.   Assessment and Plan:  Status post left hallux soft tissue mass excision, doing well with no complications   -Treatment options discussed including all alternatives, risks, and complications -Sutures removed today without complications incisions appear to be healed.  Recommended small mount of antibiotic ointment and bandage.  Monitor for any signs or symptoms of reoccurrence or infection. -Monitor for any clinical signs or symptoms of infection and DVT/PE and directed to call the office immediately should any occur or go to the ER. -Follow-up prn. In the meantime, encouraged to call the office with any questions, concerns, change in symptoms.   Celesta Gentile, DPM

## 2022-07-22 ENCOUNTER — Encounter: Payer: Medicare Other | Admitting: Podiatry

## 2022-08-24 ENCOUNTER — Ambulatory Visit: Payer: Self-pay

## 2022-08-24 ENCOUNTER — Ambulatory Visit (INDEPENDENT_AMBULATORY_CARE_PROVIDER_SITE_OTHER): Payer: Medicare Other | Admitting: Family Medicine

## 2022-08-24 ENCOUNTER — Encounter: Payer: Self-pay | Admitting: Family Medicine

## 2022-08-24 VITALS — BP 134/88 | Ht 71.0 in | Wt 240.0 lb

## 2022-08-24 DIAGNOSIS — M25562 Pain in left knee: Secondary | ICD-10-CM

## 2022-08-24 DIAGNOSIS — M222X2 Patellofemoral disorders, left knee: Secondary | ICD-10-CM | POA: Diagnosis present

## 2022-08-24 NOTE — Progress Notes (Signed)
  Coburn Knaus - 66 y.o. male MRN [id number]  Date of birth: 1956/05/30  SUBJECTIVE:  Including CC & ROS.  No chief complaint on file.   John Hammond is a 66 y.o. male that is presenting with acute left knee pain.  Pain is anterior in nature.  Has started jogging recently.   Review of Systems See HPI   HISTORY: Past Medical, Surgical, Social, and Family History Reviewed & Updated per EMR.   Pertinent Historical Findings include:  Past Medical History:  Diagnosis Date   Hypertension    Renal disorder 05/15/2012   kidney stones    Past Surgical History:  Procedure Laterality Date   kidney stones     LITHOTRIPSY       PHYSICAL EXAM:  VS: BP 134/88 (BP Location: Right Arm, Patient Position: Sitting)   Ht '5\' 11"'$  (1.803 m)   Wt 240 lb (108.9 kg)   BMI 33.47 kg/m  Physical Exam Gen: NAD, alert, cooperative with exam, well-appearing MSK:  Neurovascularly intact    Limited ultrasound: Left and right knee:  Left knee: No effusion. Spurring at the superior pole of the patella. Bipartite patella appreciated horizontally. Chronic changes at the tibial tubercle. No significant changes appreciated the medial or lateral compartment.  Right knee: No effusion. No significant changes in the medial or lateral compartment.  Summary: Left knee with bipartite patella.  Ultrasound and interpretation by Clearance Coots, MD    ASSESSMENT & PLAN:   Patellofemoral pain syndrome of left knee Acutely occurring.  Symptoms more consistent with patellofemoral syndrome.  He does have some spurring appreciated in the quad tendon as well as a bipartite patella with chronic changes of the tibial tubercle. -Counseled on home exercise therapy and supportive care. -Counseled on Compression. -Could consider shockwave therapy

## 2022-08-24 NOTE — Patient Instructions (Signed)
Good to see you Please use ice if needed  You can consider compression  Please try the exercises   Please send me a message in MyChart with any questions or updates.  Please see me back as needed.   --Dr. Raeford Razor

## 2022-08-24 NOTE — Assessment & Plan Note (Signed)
Acutely occurring.  Symptoms more consistent with patellofemoral syndrome.  He does have some spurring appreciated in the quad tendon as well as a bipartite patella with chronic changes of the tibial tubercle. -Counseled on home exercise therapy and supportive care. -Counseled on Compression. -Could consider shockwave therapy

## 2022-10-05 ENCOUNTER — Telehealth: Admit: 2022-10-05 | Payer: PRIVATE HEALTH INSURANCE | Attending: Urology | Primary: Urology

## 2022-10-05 ENCOUNTER — Ambulatory Visit: Admit: 2022-10-05 | Payer: MEDICARE | Attending: Urology | Primary: Urology

## 2022-10-05 DIAGNOSIS — N200 Calculus of kidney: Secondary | ICD-10-CM

## 2022-10-05 DIAGNOSIS — N401 Enlarged prostate with lower urinary tract symptoms: Secondary | ICD-10-CM

## 2022-10-05 DIAGNOSIS — E291 Testicular hypofunction: Secondary | ICD-10-CM

## 2022-10-05 DIAGNOSIS — N138 Other obstructive and reflux uropathy: Secondary | ICD-10-CM

## 2022-10-05 DIAGNOSIS — N399 Disorder of urinary system, unspecified: Secondary | ICD-10-CM

## 2022-10-05 DIAGNOSIS — Z125 Encounter for screening for malignant neoplasm of prostate: Secondary | ICD-10-CM

## 2022-10-05 MED ORDER — TESTOSTERONE 1 % (50 MG/5 GRAM) TRANSDERMAL GEL PACKET
1 % (50 mg/5 gram) | Freq: Every day | TRANSDERMAL | 1 refills | Status: AC
Start: 2022-10-05 — End: 2022-11-19

## 2022-10-05 MED ORDER — ATORVASTATIN 20 MG TABLET
20 mg | ORAL | Status: AC
Start: 2022-10-05 — End: ?

## 2022-10-05 MED ORDER — FINASTERIDE 5 MG TABLET
5 mg | ORAL | Status: AC
Start: 2022-10-05 — End: ?

## 2022-10-05 MED ORDER — ALFUZOSIN ER 10 MG TABLET,EXTENDED RELEASE 24 HR
10 mg | ORAL | Status: AC
Start: 2022-10-05 — End: ?

## 2022-10-05 NOTE — Telephone Encounter
[employer]- TESTOSTERONE GelType of coverage approved: Prior AuthorizationThis approval authorizes your coverage from 03/29/2022 - 11/06/2024Member ID Number: [id number] LVM on pt's mobile number.

## 2022-10-06 ENCOUNTER — Institutional Professional Consult (permissible substitution): Payer: Medicare Other | Admitting: Plastic Surgery

## 2022-10-06 DIAGNOSIS — R399 Unspecified symptoms and signs involving the genitourinary system: Secondary | ICD-10-CM

## 2022-10-07 ENCOUNTER — Ambulatory Visit: Admit: 2022-10-07 | Payer: MEDICARE | Attending: Cardiovascular Disease | Primary: Urology

## 2022-10-07 ENCOUNTER — Encounter: Admit: 2022-10-07 | Payer: PRIVATE HEALTH INSURANCE | Attending: Cardiovascular Disease | Primary: Urology

## 2022-10-07 DIAGNOSIS — R9431 Abnormal electrocardiogram [ECG] [EKG]: Secondary | ICD-10-CM

## 2022-10-07 DIAGNOSIS — N200 Calculus of kidney: Secondary | ICD-10-CM

## 2022-10-07 DIAGNOSIS — I10 Essential (primary) hypertension: Secondary | ICD-10-CM

## 2022-10-07 DIAGNOSIS — N318 Other neuromuscular dysfunction of bladder: Secondary | ICD-10-CM

## 2022-10-07 DIAGNOSIS — M79603 Pain in arm, unspecified: Secondary | ICD-10-CM

## 2022-10-07 DIAGNOSIS — N201 Calculus of ureter: Secondary | ICD-10-CM

## 2022-10-07 DIAGNOSIS — I2510 Atherosclerotic heart disease of native coronary artery without angina pectoris: Secondary | ICD-10-CM

## 2022-10-07 DIAGNOSIS — E291 Testicular hypofunction: Secondary | ICD-10-CM

## 2022-10-07 DIAGNOSIS — N529 Male erectile dysfunction, unspecified: Secondary | ICD-10-CM

## 2022-10-07 DIAGNOSIS — R310 Gross hematuria: Secondary | ICD-10-CM

## 2022-10-07 DIAGNOSIS — I7789 Other specified disorders of arteries and arterioles: Secondary | ICD-10-CM

## 2022-10-07 DIAGNOSIS — N210 Calculus in bladder: Secondary | ICD-10-CM

## 2022-10-07 DIAGNOSIS — E7800 Pure hypercholesterolemia, unspecified: Secondary | ICD-10-CM

## 2022-10-07 DIAGNOSIS — I2584 Coronary atherosclerosis due to calcified coronary lesion: Secondary | ICD-10-CM

## 2022-10-07 DIAGNOSIS — I872 Venous insufficiency (chronic) (peripheral): Secondary | ICD-10-CM

## 2022-10-07 DIAGNOSIS — I77810 Thoracic aortic ectasia: Secondary | ICD-10-CM

## 2022-10-07 MED ORDER — TADALAFIL 5 MG TABLET
5 mg | Freq: Every day | ORAL | Status: AC
Start: 2022-10-07 — End: ?

## 2022-10-07 NOTE — Progress Notes
[facility] Heart and VascularOUTPATIENT CONSULTATION NOTEConsult Information:Consultation requested by: Neale Burly., MDReason for referral : TAAPresentation History:Spencer Nelson is a 66 y.o. male with past medical history significant for:1. TAA2. HTN3. HLDwho is referred to re-establish cardiac care.  He was previously seen by Dr Rush Barer for management of HTN and aorta enlargement.  He has not been seen since 2020Prior HistoryThe patient was last seen by Dr Rush Barer in 2020.  At that time he was doing well and no changes were made.  He was sent for a screening stress test because of cor calcifications.  He was also sent for an echo to monitor his aorta size but he never had either of these studies doneHe reports that he travels a lot -- he lives in [place] and [place] and he is planning on moving to [place].  He would like to cont to get his cardiac care, however, at [facility].  He reports that since his last visit with Dr Rush Barer no major changes in his health.  He reports that he recently retired and is now walking at least 3 miles a day.  He also goes to the gym a few times a week -- does high reps but low weights.  He has no chest discomfort or SOB.  No functional limitations at all.  He reports that he has not been monitoring his bp at home.  No palpitations.  No LH or dizziness.  No syncope or presyncope.  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 He  has a past surgical history that includes Ureteral stent placement (Right, 04/28/12) and Vein ligation and stripping. Social History Family History He  reports that he has never smoked. He has never used smokeless tobacco. He reports that he does not drink alcohol and does not use drugs. No family history on file. Medications Current Outpatient Medications Medication Sig Dispense Refill ? alfuzosin (UROXATRAL) 10 mg 24 hr extended release tablet Take 1 tablet (10 mg total) by mouth.   ? aspirin 81 MG EC tablet Take 1 tablet (81 mg total) by mouth daily.   ? atorvastatin (LIPITOR) 20 mg tablet Take 1 tablet (20 mg total) by mouth daily.   ? finasteride (PROSCAR) 5 mg tablet Take 1 tablet (5 mg total) by mouth daily.   ? losartan (COZAAR) 50 mg tablet Take 1 tablet (50 mg total) by mouth daily. 90 tablet 3 ? multivitamin capsule Take 1 capsule by mouth daily.   ? tadalafiL (CIALIS) 5 mg tablet Take 1 tablet (5 mg total) by mouth daily.   ? testosterone (ANDROGEL) 1 % (50 mg/5 gram) transdermal gel packet Place 5 g onto the skin daily. Apply to dry area of shoulders, upper arms, or abdomen;let dry,cover with shirt, wash hands. 150 g 0 No current facility-administered medications for this visit.  Allergies He has No Known Allergies. Review of Systems:All 14 point review of systems are reviewed and are negative except for the HPI. Physical Exam:Vitals:Vitals:  10/07/22 1108 BP: 102/64 Site: Right Arm Position: Sitting Cuff Size: Large Pulse: 81 SpO2: 95% Weight: 110.6 kg Height: 5' 11 (1.803 m) Exam:Physical ExamVitals reviewed. Constitutional:     General: He is not in acute distress.   Appearance: Normal appearance. HENT:    Head: Normocephalic and atraumatic.    Mouth/Throat:    Mouth: Mucous membranes are moist. Neck:    Vascular: No carotid bruit or JVD. Cardiovascular:    Rate and Rhythm: Normal rate and regular rhythm.    Pulses: Normal pulses.    Heart sounds: Normal heart sounds. No murmur heard.Pulmonary:    Effort: Pulmonary effort is normal.    Breath sounds: Normal breath sounds. No wheezing, rhonchi or rales. Abdominal:    General: Abdomen is flat. Bowel sounds are normal.    Palpations: Abdomen is soft. Musculoskeletal:       General: No swelling.    Right lower leg: No edema.    Left lower leg: No edema. Skin:   General: Skin is warm and dry.    Capillary Refill: Capillary refill takes less than 2 seconds. Neurological:    General: No focal deficit present.    Mental Status: He is alert. Mental status is at baseline. Psychiatric:       Mood and Affect: Mood normal.       Behavior: Behavior normal. Review of Labs/Diagnostics:LABSLipid Panel:10/01/22 TC 151 TG 38 HDL 65 LDL 77Total Cholesterol Cholesterol, Total (mg/dL) Date Value 78/29/5621 167  LDL LDL Cholesterol (mg/dL (calc)) Date Value 30/86/5784 113  HDL HDL (mg/dL) Date Value 69/62/9528 39 (L)  Triglycerides Triglycerides (mg/dL) Date Value 41/32/4401 75   Complete Metabolic Panel:11/23 HgA1C 5.2 Glc 102 BUn 20 Cr 1.18 Na 143 K 110 HCO3 22LFTS WNLSodium Sodium (mmol/L) Date Value 06/05/2019 137 09/25/2016 141  Potasium Potassium (mmol/L) Date Value 06/05/2019 4.4 09/25/2016 4.7  Chloride Chloride (mmol/L) Date Value 06/05/2019 106 09/25/2016 102  Bicarb CO2 (mmol/L) Date Value 06/05/2019 20 09/25/2016 20 (L)  BUN BUN (mg/dL) Date Value 02/72/5366 20 09/25/2016 35 (H)  Creatinine Creatinine (mg/dL) Date Value 44/01/4741 1.20 09/25/2016 1.53 (H)  Glucose Glucose (mg/dL) Date Value 59/56/3875 116 (H) 09/25/2016 94  Calcium No results found for: CALC Uric Acid No results found for: URICACID AST Aspartate Aminotransferase (AST) (U/L) Date Value 06/05/2019 39 (H) 09/14/2018 26  ALT Alanine Aminotransferase (ALT) (U/L) Date Value 06/05/2019 44 (H) 09/14/2018 36  TSH No results found for: TSH, TSH3GEN HgA1C No results found for: HGBA1C BNP No results found for: BNPPRO  Complete Blood Count:11/23: WBC 5.7 Hg 12.7 Hct 40 Plt 206WBC WBC (x1000/?L) Date Value 06/05/2019 6.1 09/14/2018 6.58 09/25/2016 7.0  Hemoglobin Hemoglobin (g/dL) Date Value 64/33/2951 16.2 09/14/2018 14.2 01/14/2015 14.3 01/04/2014 16.0  Hematocrit Hematocrit (%) Date Value 06/05/2019 45.5 09/14/2018 40.3 01/14/2015 42.2 01/04/2014 48.6  Platelets Platelets (x1000/?L) Date Value 06/05/2019 264 09/14/2018 215   PT/INRNo results found for: INR, PROTIMEPast Cardiac Data:EKG: 11/9/23ECHO: TTE 3/17* Limited Study to assess Aortic dimensions* Trileaflet aortic valve with trace regurgitation* Dilated aortic root with effacement of the Sino-tubular junction and dilated ascending aorta* Ascending aorta 4.6-4.7 cm or 2.0 cm/m2 BSA* Increase in ascending aortic maximal dimension compared to the echocardiogram of August 2016 (4.2 cm at that time).* Overall LVEF appears preserved with fair endocardial resolution; no Definity contrast employed.STRESS TESTS:Ex MPI 8/16?IMPRESSION1.Probably normal myocardial perfusion Tc63m Tetrofosmin SPECT imaging after exercise to a diagnostic peak heart rate without reproduction of his presenting symptoms or ischemic ST segment changes with imaging most consistent  with diaphragmatic soft tissue attenuation.2.Resting LV function was minimally reduced due to septal dyssynergy.  3.The patient had no symptoms of angina. The stress ECG was negative for ischemia. The hemodynamic response was notable for post exercise hypotension. ?CARDIAC CATHS:HOLTER MONITOR:EVENT MONITOR:No results found for this or any previous visit.CAROTID DOPPLER:No results found for this or any previous visit.CORONARY CALCIUM SCORE:2013: Ca Score 15OTHER DIAGNOSTIC IMAGING:CTA CHest 1/18IMPRESSION:Not significantly changed 44 mm ascending aortic aneurysm.CTA Chest 9/17IMPRESSION:Ascending thoracic aortic aneurysm measuring up to 4.6 cm at the level of the sinus of Valsalva. No acute aortic process.CTA Chest 2014IMPRESSION: Negative for acute pulmonary thromboembolism. Mild aneurysmal dilatation of the ascending aorta, measuring up to 4.5 cm. Assessment/Impression:1. TAA -- pt noted to have enlarged ascending aorta since 2014.  Has not had repeat imaging since 2018.  Will repeat echo now.  Depending on size of the aorta, will decide on frequency and type of future imaging and screening.  If there has been interval enlargement, I would start a bblocker to help decrease shear stress.  I have also reminded him the importance of avoiding heavy lifting and straining.  He expresses understanding--TTE.  If further enlargement of the aorta, will add bblocker (will need to decrease losartan to allow room in bp for addition of bblocker)2. HTN: bp is very well controlled.  Cont current meds for now (see above)3. HLD: very well controlled.  Cont current meds4. Cor calcification - pt with evidence of mild cor calcification.  He has no signs or sx of ischemia at a very good workload.  Do not feel that repeat ischemia testing is needed at this time.  Will cont with aggressive risk factor management (see above)Recommendations:TTECont current medsRTC 1 yr or sooner if new issues ariseRebecca Maxx Calaway, MDAssistant ProfessorInterventional CardiologyYale School of Medicine11/07/2022

## 2022-10-09 NOTE — Progress Notes
[name] is a 66 y.o. male  who presents with a chief complaint of:  Encounter Diagnoses Name SNOMED [place](R) Primary? ? Disease of urinary tract DISORDER OF URINARY TRACT Yes ? 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 Lower urinary tract symptoms (LUTS) LOWER URINARY TRACT SYMPTOMS  ? Nephrolithiasis KIDNEY STONE  ? Prostate cancer screening PATIENT ENCOUNTER STATUS   Patient has a history of BPH, nephrolithiasis. Patient denies new urinary symptoms. Patient reports that he does not want to use Gemtesa. Patient reports that he occasionally has a sudden urge to urinate. Patient would prefer to go back on testostrone.Patient denies any symptoms in the context of nephrolithiasis. Patient denies fever, chills, dysuria, bone pain, flank pain and abdominal pain at this time.Patient reports that he started running again and his weight fluctuated (voluntarily lost 90 lbs). Patient reports that he has retired.Past Medical HistoryPast Medical History: Diagnosis Date ? Arm pain 09/17/2013 ? Ascending aorta dilatation (HC Code) (HC CODE) (HC Code) 12/20/2012  Echo asc Dilated aortic root measuring 4.5 cm at the mid ascending aorta; 4.6 cm 2017 ? Bladder calculi  ? Calculus of kidney 05/15/2012 ? Calculus of ureter 05/30/2012 ? Coronary artery calcification 11/14/2012  Agaston score 15: 2013  ? Essential hypertension 12/14/2016 ? Gross hematuria  ? Gross hematuria 04/13/2012 ? Hypertension  ? Impotence of organic origin 03/30/2012 ? Nonspecific abnormal electrocardiogram (ECG) (EKG) 11/10/2012 ? Other calculus in bladder 05/10/2012 ? Other functional disorder of bladder 03/30/2012 ? Other testicular hypofunction 05/10/2012 ? Pure hypercholesterolemia 05/28/2013 ? Renal calculi  ? Ureteral calculus  ? Venous insufficiency of leg 12/20/2012 Past Surgical HistoryPast Surgical History: Procedure Laterality Date ? URETERAL STENT PLACEMENT Right 04/28/12 ? VEIN LIGATION AND STRIPPING   AllergiesNo Known AllergiesMedicationsOutpatient Encounter Medications as of 10/05/2022 Medication Sig Dispense Refill ? alfuzosin (UROXATRAL) 10 mg 24 hr extended release tablet Take 1 tablet (10 mg total) by mouth.   ? aspirin 81 MG EC tablet Take 1 tablet (81 mg total) by mouth daily.   ? atorvastatin (LIPITOR) 20 mg tablet Take 1 tablet (20 mg total) by mouth daily.   ? finasteride (PROSCAR) 5 mg tablet Take 1 tablet (5 mg total) by mouth daily.   ? multivitamin capsule Take 1 capsule by mouth daily.   ? [DISCONTINUED] CIALIS 20 mg tablet TAKE 1 TABLET BY MOUTH EVERY DAY AS NEEDED 20 tablet 0 ? [DISCONTINUED] losartan (COZAAR) 50 mg tablet TAKE 1 TABLET(50 MG) BY MOUTH DAILY 90 tablet 3 ? [DISCONTINUED] lovastatin (MEVACOR) 10 mg tablet TAKE 1 TABLET(10 MG) BY MOUTH DAILY WITH DINNER 90 tablet 1 ? losartan (COZAAR) 50 mg tablet Take 1 tablet (50 mg total) by mouth daily. 90 tablet 3 ? testosterone (ANDROGEL) 1 % (50 mg/5 gram) transdermal gel packet Place 5 g onto the skin daily. Apply to dry area of shoulders, upper arms, or abdomen;let dry,cover with shirt, wash hands. 150 g 0 ? [DISCONTINUED] aspirin-calcium carbonate 81 mg-300 mg calcium(777 mg) Tab Take 81 mg by mouth.   ? [DISCONTINUED] ibuprofen (ADVIL,MOTRIN) 600 mg tablet Take 1 tablet (600 mg total) by mouth every 6 (six) hours as needed. 60 tablet 0 ? [DISCONTINUED] lovastatin (MEVACOR) 10 mg tablet Take by mouth.   ? [DISCONTINUED] tamsulosin (FLOMAX) 0.4 mg 24 hr capsule Take 2 capsules (0.8 mg total) by mouth daily. 180 capsule 3 ? [DISCONTINUED] tamsulosin (FLOMAX) 0.4 mg Cap 24 hr capsule TAKE 1 CAPSULE(0.4 MG) BY  MOUTH DAILY 90 capsule 0 ? [DISCONTINUED] testosterone (ANDROGEL) 1 % (50 mg/5 gram) transdermal gel packet Place 5 g onto the skin daily. Apply to dry area of shoulders, upper arms, or abdomen;let dry,cover with shirt, wash hands. (Patient not taking: Reported on 10/05/2022) 150 g 0 ? [DISCONTINUED] TESTOSTERONE TD Place onto the skin.   No facility-administered encounter medications on file as of 10/05/2022.  Social HistorySocial History Tobacco Use ? Smoking status: Never ? Smokeless tobacco: Never Substance Use Topics ? Alcohol use: No   Alcohol/week: 0.0 - 0.8 standard drinks of alcohol   Comment: Rare ? Drug use: No  Family History No family history on file.Review of SystemsNo symptoms beyond those noted in HPI and PMH- all other systems are negativePhysical ExamHt 5' 11 (1.803 m)  - Wt 108 kg  - BMI 33.19 kg/m? Constitutional: Oriented to person, place, and time. Appears well-developed and well-nourished. HENT: Trachea appears midlineHead: Normocephalic and atraumatic. Eyes: Conjunctivae are normal Neck: No tracheal deviation present. Pulmonary/Chest: Effort normal. Musculoskeletal: Normal range of motion. Neurological: alert and oriented to person, place, and time. No focal deficits are evident. Skin: Skin is dry an d without obvious lesionPsychiatric: Has a normal mood and affect. Behavior is normal. Judgment and thought content normal. Lab Results Component Value Date  UCOLOR yellow 10/05/2022  UGLUCOSE Negative 10/05/2022  UKETONE Negative 10/05/2022  USPECGRAVITY 1.015 10/05/2022  UPH 6.0 06/28/2012  UPROTEIN Negative 10/05/2022  UNITRATES Negative 10/05/2022  UBLOOD Negative 10/05/2022  ULEUKOCYTES Negative 10/05/2022    Post Void Bladder Scan Measurement Date Value Ref Range Status 09/14/2018 107 mL Final Lab Results Component Value Date  PSA 0.820 09/14/2018  PSA 0.820 09/25/2016  PSA 0.7 01/14/2015 Assessment & Plan:Spencer Nelson is a 66 y.o. male Encounter Diagnoses Name SNOMED [place](R) Primary? ? Disease of urinary tract DISORDER OF URINARY TRACT Yes ? 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 Lower urinary tract symptoms (LUTS) LOWER URINARY TRACT SYMPTOMS  ? Nephrolithiasis KIDNEY STONE  ? Prostate cancer screening PATIENT ENCOUNTER STATUS  Orders Placed This Encounter Procedures ? PSA, total     (Q) ? Testosterone, total, LC/MS/MS     (Q) ? POC urinalysis manual w/o scope ? CBC and differential ? Comprehensive metabolic panel We had a discussion about the patient's medications. We also reviewed the patient's blood work and discussed his potential to be an anemic. Reviewed the patient's PSA values and trends and the patients PSA values are within the normal range for his age and medical history. Patient advised to stay on his current medications. I have prescribed testostrone for the patient. Will check labs in three months - reviewed importance of monitoringReviewed BPH management- has been managing OK with combination therapy with alfuzosin and finasteride and will thus continue - not ready for surgical consideration at the present moment.Return in about 3 months (around 01/05/2023) for CBC and CMP.Patient will let us know if any new problems or symptoms arise in the Delanna Ahmadi, MDScribed for Maia Petties., MD by Tilden Fossa, medical scribe October 09, 2022  The documentation recorded by the scribe accurately reflects the services I personally performed and the decisions made by me. I reviewed and confirmed all material entered and/or pre-charted by the scribe.

## 2022-10-11 ENCOUNTER — Institutional Professional Consult (permissible substitution): Payer: Medicare Other | Admitting: Plastic Surgery

## 2022-10-12 ENCOUNTER — Ambulatory Visit: Admit: 2022-10-12 | Payer: MEDICARE | Primary: Urology

## 2022-10-12 ENCOUNTER — Encounter: Payer: Self-pay | Admitting: Gastroenterology

## 2022-10-14 ENCOUNTER — Telehealth: Admit: 2022-10-14 | Payer: PRIVATE HEALTH INSURANCE | Attending: Urology | Primary: Urology

## 2022-10-14 NOTE — Telephone Encounter
Contacted pt. Pt verbalized copay for Testosterone was $300 and wanted to know if he could get medication cheaper. Pt will research Goodrx in his area and get back to office. Will just send message as update to provider.

## 2022-10-14 NOTE — Telephone Encounter
Pt called in c/o that the medication is to much costing $300 and wanted to know if there are any other possible options. Please return call to [phone].

## 2022-10-20 ENCOUNTER — Ambulatory Visit (AMBULATORY_SURGERY_CENTER): Payer: Medicare Other | Admitting: *Deleted

## 2022-10-20 VITALS — Ht 72.0 in | Wt 230.0 lb

## 2022-10-20 DIAGNOSIS — Z86010 Personal history of colonic polyps: Secondary | ICD-10-CM

## 2022-10-20 MED ORDER — NA SULFATE-K SULFATE-MG SULF 17.5-3.13-1.6 GM/177ML PO SOLN: 1.0000 | Freq: Once | ORAL | 0 refills | Status: AC

## 2022-10-20 NOTE — Progress Notes (Signed)
Pt's previsit is done over the phone and all paperwork (prep instructions, blank consent form to just read over) sent to patient.  Pt's name and DOB verified at the beginning of the previsit.  Pt denies any difficulty with ambulating.   No egg or soy allergy known to patient  No issues known to pt with past sedation with any surgeries or procedures No trouble moving neck No FH of Malignant Hyperthermia Pt is not on diet pills Pt is not on  home 02  Pt is not on blood thinners  Pt denies issues with constipation  Pt encouraged to use to use Singlecare or Goodrx to reduce cost  Patient's chart reviewed by Osvaldo Angst CNRA prior to previsit and patient appropriate for the [place].  Previsit completed and red dot placed by patient's name on their procedure day (on provider's schedule).

## 2022-10-29 ENCOUNTER — Telehealth: Payer: Self-pay | Admitting: Gastroenterology

## 2022-10-29 NOTE — Telephone Encounter (Signed)
Good Morning Dr. Havery Moros,   Patient called stating that he needed to reschedule his colonoscopy with you for tomorrow at 9:30 due to his care partner that was supposed to bring him and stay with him not being able to come anymore.   Patient was rescheduled for 1/30 at 8:30

## 2022-10-29 NOTE — Telephone Encounter (Signed)
Sorry to hear this. Please let him know he will be charged a cancellation fee, John Hammond if you can let him know. Thanks

## 2022-10-30 ENCOUNTER — Encounter: Payer: Medicare Other | Admitting: Gastroenterology

## 2022-11-10 ENCOUNTER — Institutional Professional Consult (permissible substitution): Payer: Medicare Other | Admitting: Plastic Surgery

## 2022-11-15 ENCOUNTER — Encounter: Admit: 2022-11-15 | Payer: PRIVATE HEALTH INSURANCE | Attending: Urology | Primary: Urology

## 2022-11-15 DIAGNOSIS — E291 Testicular hypofunction: Secondary | ICD-10-CM

## 2022-11-15 NOTE — Telephone Encounter
[facility]:  REFILL MESSAGETime Of Call:   9:28 AMCaller:   PeterCaller's relationship to patient:  n/a  Calling from [employer], agency, etc.):  n/aReason for call:   RefillHas patient been seen in the last 12 months:  yes  If no, informed caller that an appointment needs to be scheduled because the last one was more than 12 months ago and that a message will be sent to the clinical team who will call them back or refill the medication if appropriate.Name of medication:  testosterone (ANDROGEL) 1 % (50 mg/5 gram) transdermal gel packetPharmacy name:   [id number] [place] [place] [facility] [address] number:  [phone] patient contact pharmacy and was told there are no refills remaining (if no, ask caller to call pharmacy and request electronic refill request): YesHow many days of medication does patient have left (route as high priority if 3 business days or less):  4 daysBest telephone number for callback:   [phone] Best time to return call:   anytime Permission to leave message:  yesEmily [facility] Specialist

## 2022-11-19 MED ORDER — TESTOSTERONE 1 % (50 MG/5 GRAM) TRANSDERMAL GEL PACKET
1 % (50 mg/5 gram) | Freq: Every day | TRANSDERMAL | 3 refills | Status: AC
Start: 2022-11-19 — End: ?

## 2022-12-14 ENCOUNTER — Telehealth: Payer: Self-pay | Admitting: Gastroenterology

## 2022-12-14 DIAGNOSIS — Z86010 Personal history of colonic polyps: Secondary | ICD-10-CM

## 2022-12-14 MED ORDER — NA SULFATE-K SULFATE-MG SULF 17.5-3.13-1.6 GM/177ML PO SOLN: 1.0000 | Freq: Once | ORAL | 0 refills | Status: AC

## 2022-12-14 NOTE — Telephone Encounter (Signed)
Inbound call from patient stating that he had moved and has misplaced his prep for his procedure on 1/30. Patient is [facility] [address] [facility] [place]. Please advise.

## 2022-12-14 NOTE — Telephone Encounter (Signed)
Pharmacy verified with pt rx sent.new instructions sent via my chart per request.verbalized understanding.

## 2022-12-28 ENCOUNTER — Encounter: Payer: Medicare Other | Admitting: Gastroenterology

## 2022-12-28 ENCOUNTER — Telehealth: Payer: Self-pay | Admitting: Gastroenterology

## 2022-12-28 DIAGNOSIS — Z86010 Personal history of colonic polyps: Secondary | ICD-10-CM

## 2022-12-28 NOTE — Telephone Encounter (Signed)
Good Morning John Hammond  This patient had a procedure today however he canceled  due to the fact that he could not take the second part of the prep. He would like to get a different prep perhaps pills if possible and reschedule at that time please.

## 2022-12-28 NOTE — Telephone Encounter (Signed)
Good Morning Dr. Havery Moros,   I called this patient this morning at 7:50am he stated he tried calling this morning at 4 am to say he could not take the second part of the prep he is wanting to reschedule and get a different prep perhaps pills. I will send a message to your cma and have them call him back to reschedule and recommend a different prep.

## 2022-12-28 NOTE — Telephone Encounter (Signed)
Okay thanks for letting me know. Unfortunately it does not appear the call team got any message from him overnight. He can reschedule at his convenience. Thanks

## 2022-12-29 MED ORDER — SUTAB 1479-225-188 MG PO TABS: 24.0000 | ORAL_TABLET | ORAL | 0 refills | Status: DC

## 2022-12-29 NOTE — Telephone Encounter (Signed)
Called patient and rescheduled him for colonoscopy with [facility] on Tuesday, 2-20 at 10:00am. Patient would like to try Sutab.  His instructions will be mailed. Sent Sutab to [facility].  He will call after he reviews his instructions and let us know if he has any questions.

## 2023-01-18 ENCOUNTER — Encounter: Payer: Self-pay | Admitting: Gastroenterology

## 2023-01-18 ENCOUNTER — Ambulatory Visit (AMBULATORY_SURGERY_CENTER): Payer: BC Managed Care – PPO | Admitting: Gastroenterology

## 2023-01-18 VITALS — BP 123/82 | HR 74 | Temp 97.8°F | Resp 13 | Ht 72.0 in | Wt 230.0 lb

## 2023-01-18 DIAGNOSIS — D123 Benign neoplasm of transverse colon: Secondary | ICD-10-CM

## 2023-01-18 DIAGNOSIS — Z86010 Personal history of colonic polyps: Secondary | ICD-10-CM

## 2023-01-18 DIAGNOSIS — Z09 Encounter for follow-up examination after completed treatment for conditions other than malignant neoplasm: Secondary | ICD-10-CM

## 2023-01-18 HISTORY — PX: COLONOSCOPY: SHX174

## 2023-01-18 MED ORDER — SODIUM CHLORIDE 0.9 % IV SOLN: 500.0000 mL | Freq: Once | INTRAVENOUS | Status: DC

## 2023-01-18 NOTE — Progress Notes (Signed)
[facility] History and Physical   Primary Care Physician:  Pcp, No   Reason for Procedure:   History of colon polyps  Plan:    colonoscopy     HPI: John Hammond is a 67 y.o. male  here for colonoscopy surveillance - history of polyps removed. Last exam 2014 - one adenoma.   Patient denies any bowel symptoms at this time. No family history of colon cancer known. Otherwise feels well without any cardiopulmonary symptoms.   I have discussed risks / benefits of anesthesia and endoscopic procedure with Claretta Fraise and they wish to proceed with the exams as outlined today.    Past Medical History:  Diagnosis Date   Hyperlipidemia    Hypertension    Renal disorder 05/15/2012   kidney stones    Past Surgical History:  Procedure Laterality Date   COLONOSCOPY  01/18/2023   kidney stones     LITHOTRIPSY      Prior to Admission medications   Medication Sig Start Date End Date Taking? Authorizing Provider  alfuzosin (UROXATRAL) 10 MG 24 hr tablet Take 10 mg by mouth daily with breakfast.   Yes [provider]  aspirin EC 81 MG tablet Take 1 tablet by mouth daily.   Yes [provider]  atorvastatin (LIPITOR) 20 MG tablet Take 20 mg by mouth daily.   Yes [provider]  finasteride (PROSCAR) 5 MG tablet Take 5 mg by mouth daily.   Yes [provider]  GEMTESA 75 MG TABS Take 1 tablet by mouth daily. 01/14/23  Yes [provider]  losartan (COZAAR) 50 MG tablet Take 50 mg by mouth daily.   Yes [provider]  Tadalafil (CIALIS PO) Take by mouth. PRN   Yes [provider]  betamethasone dipropionate 0.05 % lotion Apply topically daily as needed. 01/13/23   [provider]  testosterone (ANDROGEL) 50 MG/5GM (1%) GEL 5 g daily.    [provider]    Current Outpatient Medications  Medication Sig Dispense Refill   alfuzosin (UROXATRAL) 10 MG 24 hr tablet Take 10 mg by mouth daily with  breakfast.     aspirin EC 81 MG tablet Take 1 tablet by mouth daily.     atorvastatin (LIPITOR) 20 MG tablet Take 20 mg by mouth daily.     finasteride (PROSCAR) 5 MG tablet Take 5 mg by mouth daily.     GEMTESA 75 MG TABS Take 1 tablet by mouth daily.     losartan (COZAAR) 50 MG tablet Take 50 mg by mouth daily.     Tadalafil (CIALIS PO) Take by mouth. PRN     betamethasone dipropionate 0.05 % lotion Apply topically daily as needed.     testosterone (ANDROGEL) 50 MG/5GM (1%) GEL 5 g daily.     Current Facility-Administered Medications  Medication Dose Route Frequency Provider Last Rate Last Admin   0.9 %  sodium chloride infusion  500 mL Intravenous Once Jaymien Landin, Carlota Raspberry, MD        Allergies as of 01/18/2023   (No Known Allergies)    Family History  Problem Relation Age of Onset   Colon cancer Neg Hx    Esophageal cancer Neg Hx    Rectal cancer Neg Hx    Stomach cancer Neg Hx     Social History   Socioeconomic History   Marital status: Married    Spouse name: Not on file   Number of children: Not on file   Years  of education: Not on file   Highest education level: Not on file  Occupational History   Not on file  Tobacco Use   Smoking status: Never   Smokeless tobacco: Never  Vaping Use   Vaping Use: Never used  Substance and Sexual Activity   Alcohol use: Yes    Comment: glass of wine monthly or so   Drug use: Never   Sexual activity: Yes  Other Topics Concern   Not on file  Social History Narrative   Not on file   Social Determinants of Health   Financial Resource Strain: Not on file  Food Insecurity: Not on file  Transportation Needs: Not on file  Physical Activity: Not on file  Stress: Not on file  Social Connections: Not on file  Intimate Partner Violence: Not on file    Review of Systems: All other review of systems negative except as mentioned in the HPI.  Physical Exam: Vital signs BP (!) 138/97   Pulse 81   Temp 97.8 F (36.6 C)  (Temporal)   Resp 15   Ht 6' (1.829 m)   Wt 230 lb (104.3 kg)   SpO2 96%   BMI 31.19 kg/m   General:   Alert,  Well-developed, pleasant and cooperative in NAD Lungs:  Clear throughout to auscultation.   Heart:  Regular rate and rhythm Abdomen:  Soft, nontender and nondistended.   Neuro/Psych:  Alert and cooperative. Normal mood and affect. A and O x 3  Jolly Mango, MD [facility]

## 2023-01-18 NOTE — Progress Notes (Signed)
Report to PACU, RN, vss, BBS= Clear.  

## 2023-01-18 NOTE — Op Note (Signed)
John Hammond Patient Name: John Hammond Procedure Date: 01/18/2023 9:59 AM MRN: [id number] Endoscopist: Remo Lipps P. Havery Moros , MD, [id number] Age: 67 Referring MD:  Date of Birth: Feb 01, 1956 Gender: Male Account #: [id number] Procedure:                Colonoscopy Indications:              High risk colon cancer surveillance: Personal                            history of colonic polyps - small adenoma removed                            2014 Medicines:                Monitored Anesthesia Care Procedure:                Pre-Anesthesia Assessment:                           - Prior to the procedure, a History and Physical                            was performed, and patient medications and                            allergies were reviewed. The patient's tolerance of                            previous anesthesia was also reviewed. The risks                            and benefits of the procedure and the sedation                            options and risks were discussed with the patient.                            All questions were answered, and informed consent                            was obtained. Prior Anticoagulants: The patient has                            taken no anticoagulant or antiplatelet agents. ASA                            Grade Assessment: II - A patient with mild systemic                            disease. After reviewing the risks and benefits,                            the patient was deemed in satisfactory condition to  undergo the procedure.                           After obtaining informed consent, the colonoscope                            was passed under direct vision. Throughout the                            procedure, the patient's blood pressure, pulse, and                            oxygen saturations were monitored continuously. The                            CF [id number] was introduced through the anus                             and advanced to the the cecum, identified by                            appendiceal orifice and ileocecal valve. The                            colonoscopy was performed without difficulty. The                            patient tolerated the procedure well. The quality                            of the bowel preparation was adequate. The                            ileocecal valve, appendiceal orifice, and rectum                            were photographed. Scope In: 10:14:45 AM Scope Out: 10:34:25 AM Scope Withdrawal Time: 0 hours 16 minutes 19 seconds  Total Procedure Duration: 0 hours 19 minutes 40 seconds  Findings:                 The perianal and digital rectal examinations were                            normal.                           A 3 mm polyp was found in the hepatic flexure. The                            polyp was sessile. The polyp was removed with a                            cold snare. Resection and retrieval were complete.  Residual vegetable matter was found in the sigmoid                            colon, at the splenic flexure and in the cecum.                            This took some time to wash around and clear but                            was able to visualize these areas adequately                            following lavage.                           Internal hemorrhoids were found during retroflexion.                           Anal papilla(e) were hypertrophied.                           The exam was otherwise without abnormality. Complications:            No immediate complications. Estimated blood loss:                            Minimal. Estimated Blood Loss:     Estimated blood loss was minimal. Impression:               - One 3 mm polyp at the hepatic flexure, removed                            with a cold snare. Resected and retrieved.                           - Vegetable matter noted in the  sigmoid colon, at                            the splenic flexure and in the cecum as above but                            adequate visualization was achieved.                           - Internal hemorrhoids.                           - Anal papilla(e) were hypertrophied.                           - The examination was otherwise normal. Recommendation:           - Patient has a contact number available for                            emergencies. The signs  and symptoms of potential                            delayed complications were discussed with the                            patient. Return to normal activities tomorrow.                            Written discharge instructions were provided to the                            patient.                           - Resume previous diet.                           - Continue present medications.                           - Await pathology results. Remo Lipps P. Havery Moros, MD 01/18/2023 10:41:08 AM This report has been signed electronically.

## 2023-01-18 NOTE — Patient Instructions (Signed)
Discharge instructions given. Handouts on polyps and Hemorrhoids. Resume previous medications. YOU HAD AN ENDOSCOPIC PROCEDURE TODAY AT [facility]:   Refer to the procedure report that was given to you for any specific questions about what was found during the examination.  If the procedure report does not answer your questions, please call your gastroenterologist to clarify.  If you requested that your care partner not be given the details of your procedure findings, then the procedure report has been included in a sealed envelope for you to review at your convenience later.  YOU SHOULD EXPECT: Some feelings of bloating in the abdomen. Passage of more gas than usual.  Walking can help get rid of the air that was put into your GI tract during the procedure and reduce the bloating. If you had a lower endoscopy (such as a colonoscopy or flexible sigmoidoscopy) you may notice spotting of blood in your stool or on the toilet paper. If you underwent a bowel prep for your procedure, you may not have a normal bowel movement for a few days.  Please Note:  You might notice some irritation and congestion in your nose or some drainage.  This is from the oxygen used during your procedure.  There is no need for concern and it should clear up in a day or so.  SYMPTOMS TO REPORT IMMEDIATELY:  Following lower endoscopy (colonoscopy or flexible sigmoidoscopy):  Excessive amounts of blood in the stool  Significant tenderness or worsening of abdominal pains  Swelling of the abdomen that is new, acute  Fever of 100F or higher   For urgent or emergent issues, a gastroenterologist can be reached at any hour by calling [phone]. Do not use MyChart messaging for urgent concerns.    DIET:  We do recommend a small meal at first, but then you may proceed to your regular diet.  Drink plenty of fluids but you should avoid alcoholic beverages for 24 hours.  ACTIVITY:  You should plan to take it  easy for the rest of today and you should NOT DRIVE or use heavy machinery until tomorrow (because of the sedation medicines used during the test).    FOLLOW UP: Our staff will call the number listed on your records the next business day following your procedure.  We will call around 7:15- 8:00 am to check on you and address any questions or concerns that you may have regarding the information given to you following your procedure. If we do not reach you, we will leave a message.     If any biopsies were taken you will be contacted by phone or by letter within the next 1-3 weeks.  Please call us at [phone] if you have not heard about the biopsies in 3 weeks.    SIGNATURES/CONFIDENTIALITY: You and/or your care partner have signed paperwork which will be entered into your electronic medical record.  These signatures attest to the fact that that the information above on your After Visit Summary has been reviewed and is understood.  Full responsibility of the confidentiality of this discharge information lies with you and/or your care-partner.

## 2023-01-18 NOTE — Progress Notes (Signed)
Pt's states no medical or surgical changes since previsit or office visit. 

## 2023-01-18 NOTE — Progress Notes (Signed)
Called to room to assist during endoscopic procedure.  Patient ID and intended procedure confirmed with present staff. Received instructions for my participation in the procedure from the performing physician.  

## 2023-01-19 ENCOUNTER — Ambulatory Visit: Admit: 2023-01-19 | Payer: MEDICARE | Attending: Urology | Primary: Urology

## 2023-01-19 ENCOUNTER — Telehealth: Payer: Self-pay | Admitting: *Deleted

## 2023-01-19 NOTE — Telephone Encounter (Signed)
No answer on  follow up call. Left message.   

## 2023-03-06 ENCOUNTER — Encounter: Admit: 2023-03-06 | Payer: PRIVATE HEALTH INSURANCE | Attending: Urology | Primary: Urology

## 2023-03-06 DIAGNOSIS — E291 Testicular hypofunction: Secondary | ICD-10-CM

## 2023-03-08 MED ORDER — TESTOSTERONE 1 % (50 MG/5 GRAM) TRANSDERMAL GEL PACKET
1505 % (50 mg/5 gram) | 3 refills | Status: AC
Start: 2023-03-08 — End: ?

## 2023-03-14 ENCOUNTER — Encounter: Payer: Self-pay | Admitting: *Deleted

## 2023-04-19 ENCOUNTER — Encounter: Admit: 2023-04-19 | Payer: PRIVATE HEALTH INSURANCE | Primary: Urology

## 2023-04-21 ENCOUNTER — Ambulatory Visit: Admit: 2023-04-21 | Payer: MEDICARE | Attending: Urology | Primary: Urology

## 2023-06-08 ENCOUNTER — Other Ambulatory Visit: Payer: Self-pay | Admitting: Gastroenterology

## 2023-07-08 ENCOUNTER — Encounter: Admit: 2023-07-08 | Payer: PRIVATE HEALTH INSURANCE | Attending: Urology | Primary: Urology

## 2023-07-08 DIAGNOSIS — E291 Testicular hypofunction: Secondary | ICD-10-CM

## 2023-07-11 ENCOUNTER — Encounter: Admit: 2023-07-11 | Payer: PRIVATE HEALTH INSURANCE | Primary: Urology

## 2023-07-11 ENCOUNTER — Ambulatory Visit (INDEPENDENT_AMBULATORY_CARE_PROVIDER_SITE_OTHER): Payer: Self-pay

## 2023-07-11 DIAGNOSIS — Z719 Counseling, unspecified: Secondary | ICD-10-CM

## 2023-10-04 ENCOUNTER — Ambulatory Visit: Admit: 2023-10-04 | Payer: MEDICARE | Attending: Cardiovascular Disease | Primary: Urology

## 2023-10-24 ENCOUNTER — Telehealth: Payer: Self-pay

## 2023-10-24 NOTE — Telephone Encounter (Addendum)
Patient will need to be scheduled with Dr. Karie Schwalbe. Thanks in advance.

## 2023-10-24 NOTE — Telephone Encounter (Signed)
Copied from CRM [id number]. 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

## 2023-10-25 NOTE — Telephone Encounter (Signed)
Before we just schedule him see if this is for a flight physical or something else?

## 2023-10-26 NOTE — Telephone Encounter (Signed)
Patient called back to schedule a flight physical he still doesn't have the confirmation number he said he would call back with that information

## 2023-10-26 NOTE — Telephone Encounter (Signed)
Per patient - he will need a flight physical and the form completed. Patient transferred to front office for scheduling Synetta Fail).

## 2023-10-31 DIAGNOSIS — Z719 Counseling, unspecified: Secondary | ICD-10-CM

## 2023-11-08 ENCOUNTER — Encounter: Admit: 2023-11-08 | Payer: PRIVATE HEALTH INSURANCE | Primary: Urology

## 2023-11-08 ENCOUNTER — Ambulatory Visit: Payer: BC Managed Care – PPO | Admitting: Family Medicine

## 2023-11-08 NOTE — Progress Notes (Unsigned)
CHIEF COMPLAINT: No chief complaint on file.  _____________________________________________________________ SUBJECTIVE  HPI  Pt is a 67 y.o. male here for evaluation of right shoulder pain  Recently seen by [facility], 09/02/2023 note reviewed: -Symptoms began recently when unloading a truck, felt sharp pain in shoulder.  This event occurred mid-September -Symptoms in setting of chronic intermittent pain for years -At the time of evaluation was 5/10 pain, localized to posterior lateral shoulder as well as AC joint, right upper trapezius.  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  Was recommended conservative treatment with prescription for anti-inflammatory, PT exercises.  Was also given a subacromial bursa injection and was recommended to follow-up with their office in 3 weeks time  Today pain is_ Pain is located_ Pain is_radiating _Numbness or tingling Pain exacerbated by_ Pain keeps him up at night_   -TTP AC joint, posterior lateral deltoid, right upper trapezius no periscapular atrophy.  + Speeds, equivocal O'Brien, + Hawkins, crossarm, Neer's testing.  Negative belly press, posterior liftoff, Hornblower's   Athlete/Sport:***   No chief complaint on file.  - Previous similar episode(s):  - Onset:  - Location:  - Duration:  - Character:  - Associated sx: - Alleviating factors:  - Aggravating factors:  - Therapies tried:    ------------------------------------------------------------------------------------------------------ Past Medical History:  Diagnosis Date   Hyperlipidemia    Hypertension    Renal  disorder 05/15/2012   kidney stones    Past Surgical History:  Procedure Laterality Date   COLONOSCOPY  01/18/2023   kidney stones     LITHOTRIPSY        Outpatient Encounter Medications as of 11/08/2023  Medication Sig   alfuzosin (UROXATRAL) 10 MG 24 hr tablet Take 10 mg by mouth daily with breakfast.   aspirin EC 81 MG tablet Take 1 tablet by mouth daily.   atorvastatin (LIPITOR) 20 MG tablet Take 20 mg by mouth daily.   betamethasone dipropionate 0.05 % lotion Apply topically daily as needed.   finasteride (PROSCAR) 5 MG tablet Take 5 mg by mouth daily.   GEMTESA 75 MG TABS Take 1 tablet by mouth daily.   losartan (COZAAR) 50 MG tablet Take 50 mg by mouth daily.   Tadalafil (CIALIS PO) Take by mouth. PRN   testosterone (ANDROGEL) 50 MG/5GM (1%) GEL 5 g daily.   No facility-administered encounter medications on file as of 11/08/2023.    ------------------------------------------------------------------------------------------------------  _____________________________________________________________ OBJECTIVE  PHYSICAL EXAM  There were no vitals filed for this visit. There is no height or weight on file to calculate BMI.   reviewed  General: A+Ox3, no acute distress, well-nourished, appropriate affect CV: pulses 2+ regular, nondiaphoretic, no peripheral edema, cap refill <2sec Lungs: no audible wheezing, non-labored breathing, bilateral chest rise/fall, nontachypneic Skin: warm, well-perfused, non-icteric, no susp lesions or rashes Neuro: CN II-XII intact bilaterally. Sensation intact, muscle tone wnl, no atrophy Psych: no signs of depression or anxiety MSK: ***     *** Shoulder:  No deformity, swelling or muscle wasting No scapular winging FF 180, abd 180, int 0, ext 90 NTTP over the [place], clavicle, ac, coracoid, biceps groove, humerus, deltoid, subacromial space, scap spine, musculature, trap, cervical spine Neg neer, hawkins, empty can, scarf test, hornblower,  resisted anterior flexion, subscap liftoff, speeds, obriens Neg ant drawer, sulcus sign Neg apprehension Negative Spurling's test bilat FROM of neck  12/04/2021 right shoulder x-ray independently reviewed: Mild osseous changes of the [facility] joint, greater and lesser tuberosities of the humeral head  _____________________________________________________________ ASSESSMENT/PLAN There are no diagnoses linked to this encounter.  ACJt? Summary: ***  Differential Diagnosis:  Treatment:   No follow-ups on file.  Electronically signed by: Burna Forts, MD 11/08/2023 7:25 AM

## 2023-11-10 ENCOUNTER — Encounter: Payer: Self-pay | Admitting: Family Medicine

## 2023-11-10 ENCOUNTER — Ambulatory Visit (INDEPENDENT_AMBULATORY_CARE_PROVIDER_SITE_OTHER): Payer: BC Managed Care – PPO | Admitting: Family Medicine

## 2023-11-10 VITALS — BP 120/88

## 2023-11-10 DIAGNOSIS — M7522 Bicipital tendinitis, left shoulder: Secondary | ICD-10-CM

## 2023-11-10 DIAGNOSIS — S46002A Unspecified injury of muscle(s) and tendon(s) of the rotator cuff of left shoulder, initial encounter: Secondary | ICD-10-CM

## 2023-11-10 DIAGNOSIS — M25812 Other specified joint disorders, left shoulder: Secondary | ICD-10-CM

## 2023-11-10 MED ORDER — TRIAMCINOLONE ACETONIDE 40 MG/ML IJ SUSP
40.0000 mg | Freq: Once | INTRAMUSCULAR | Status: AC
  Administered 2023-11-10: 40 mg via INTRA_ARTICULAR

## 2023-11-10 NOTE — Progress Notes (Signed)
CHIEF COMPLAINT: No chief complaint on file.  _____________________________________________________________ SUBJECTIVE  HPI  Pt is a 67 y.o. male here for evaluation of left shoulder pain  Pain has been ongoing for 3 weeks Skiing 3 weeks ago, caught it the wrong way, used pole as balance, didn't hear a tear or a pop  Pain located primarily top lateral shoulder Non Radiating No Numbness/tingling Pain exacerbated by reaching for his phone, reaching up, but not always Pain not necessarily keeping him up at night but he sleeps on his L side normally and has been unable to do so Therapies tried so far: tylenol  Of note, recently went to [facility] on 09/02/2023 for evaluation of right shoulder pain, note reviewed: -Left hand dominant, was unloading a truck and felt a sharp pain in his right shoulder 2 weeks prior to being evaluated in clinic.  This is on history of chronic right shoulder pain, posterolateral in nature, also with pain in the [facility] joint, upper trapezius, present at night without numbness, tingling, or associated neck pain.  Refractory to Advil, meloxicam - hx hip CSI -Suspected RTC strain versus tear, subacromial), AC arthritis, received a subacromial bursa injection at that time  No prior left shoulder x-ray films available ------------------------------------------------------------------------------------------------------ Past Medical History:  Diagnosis Date   Hyperlipidemia    Hypertension    Renal disorder 05/15/2012   kidney stones    Past Surgical History:  Procedure Laterality Date   COLONOSCOPY  01/18/2023   kidney stones     LITHOTRIPSY        Outpatient Encounter Medications as of 11/10/2023  Medication Sig   alfuzosin (UROXATRAL) 10 MG 24 hr tablet Take 10 mg by mouth daily with breakfast.   aspirin EC 81 MG tablet Take 1 tablet by mouth daily.   atorvastatin (LIPITOR) 20 MG tablet Take 20 mg by mouth daily.   betamethasone dipropionate 0.05 %  lotion Apply topically daily as needed.   finasteride (PROSCAR) 5 MG tablet Take 5 mg by mouth daily.   GEMTESA 75 MG TABS Take 1 tablet by mouth daily.   losartan (COZAAR) 50 MG tablet Take 50 mg by mouth daily.   Tadalafil (CIALIS PO) Take by mouth. PRN   testosterone (ANDROGEL) 50 MG/5GM (1%) GEL 5 g daily.   No facility-administered encounter medications on file as of 11/10/2023.    ------------------------------------------------------------------------------------------------------  _____________________________________________________________ OBJECTIVE  PHYSICAL EXAM  Today's Vitals   11/10/23 0916 11/10/23 0957  BP: (!) 140/86 120/88   There is no height or weight on file to calculate BMI.   reviewed  General: A+Ox3, no acute distress, well-nourished, appropriate affect CV: pulses 2+ regular, nondiaphoretic, no peripheral edema, cap refill <2sec Lungs: no audible wheezing, non-labored breathing, bilateral chest rise/fall, nontachypneic Skin: warm, well-perfused, non-icteric, no susp lesions or rashes Neuro: No focal deficit. Sensation intact, muscle tone wnl, no atrophy Psych: no signs of depression or anxiety MSK:     Left shoulder:  No deformity, swelling or muscle wasting No scapular winging FF 180, abd 180, int 0, ext symmetrical, ER symmetrical Discomfort over lateral humerus NTTP over the [place], clavicle, ac, coracoid, biceps groove, deltoid, subacromial space, scap spine, musculature, trap, cervical spine + neer, + hawkins, + empty can, - scarf test, - hornblower, - resisted anterior flexion, - subscap liftoff, equivocal belly press, - speeds, - obriens.  + Yergason with strength sustained Negative Spurling's test bilat FROM of neck _____________________________________________________________ ASSESSMENT/PLAN Diagnoses and all orders for this visit:  Impingement of left shoulder -  triamcinolone acetonide (KENALOG-40) injection 40 mg  Biceps tendinitis  of left upper extremity  Injury of left rotator cuff, initial encounter   Multiple findings on exam, consistent with shoulder impingement, likely rotator cuff injury/muscle strain, incidental finding of biceps tendinitis.  Patient notes that he is an active weightlifter as part of his exercise. Options discussed for management, interested in subacromial bursa CSI Subacromial Bursa Injection Site: left  After PARQ discussed and verbal consent given, the site was cleaned with alcohol. Steroid injection was performed using sterile technique with 4mL 1% lidocaine, 1mL 40mg /mL kenalog and 23G 1.5in needle. This was well tolerated and resulted in some relief. Dressing placed and post injection instructions were given including a discussion of likely return of pain today after the anesthetic wears off (with the possibility of worsened pain) until the steroid starts to work in 1-3 days. Call or return to clinic prn if these symptoms worsen or fail to improve as anticipated.  Discussed merits of PT, patient plans to initiate prior HEP/modified gym exercises.  May proceed with OTC therapies as needed.  Follow-up as needed.  Discussed that if pain is refractory to injection, will obtain x-ray films, additional workup as needed, reevaluation. All questions answered. Return precautions discussed. Patient verbalized understanding and is in agreement with plan  Electronically signed by: Burna Forts, MD 11/10/2023 7:28 AM

## 2023-11-16 ENCOUNTER — Ambulatory Visit: Admit: 2023-11-16 | Payer: MEDICARE | Attending: Urology | Primary: Urology

## 2023-11-16 IMAGING — DX DG SHOULDER 2+V*R*
3 series · 3 of 3 positions shown · non-contrast
Comparison: None.

CLINICAL DATA: shoulder pain

EXAM:
RIGHT SHOULDER - 2+ VIEW

[shoulder grashey]
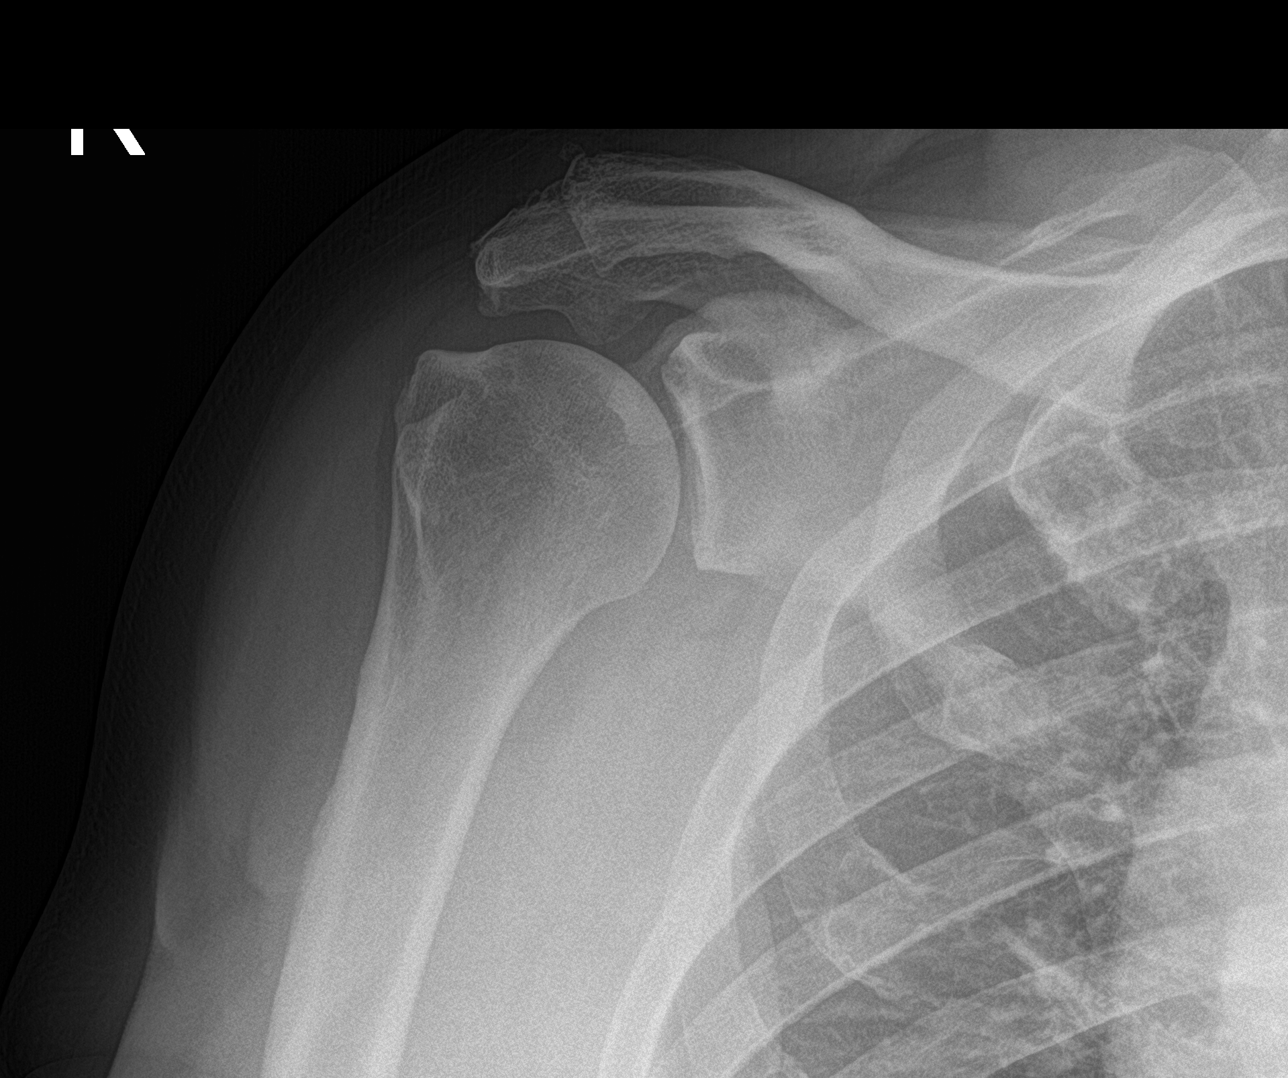

[shoulder y view]
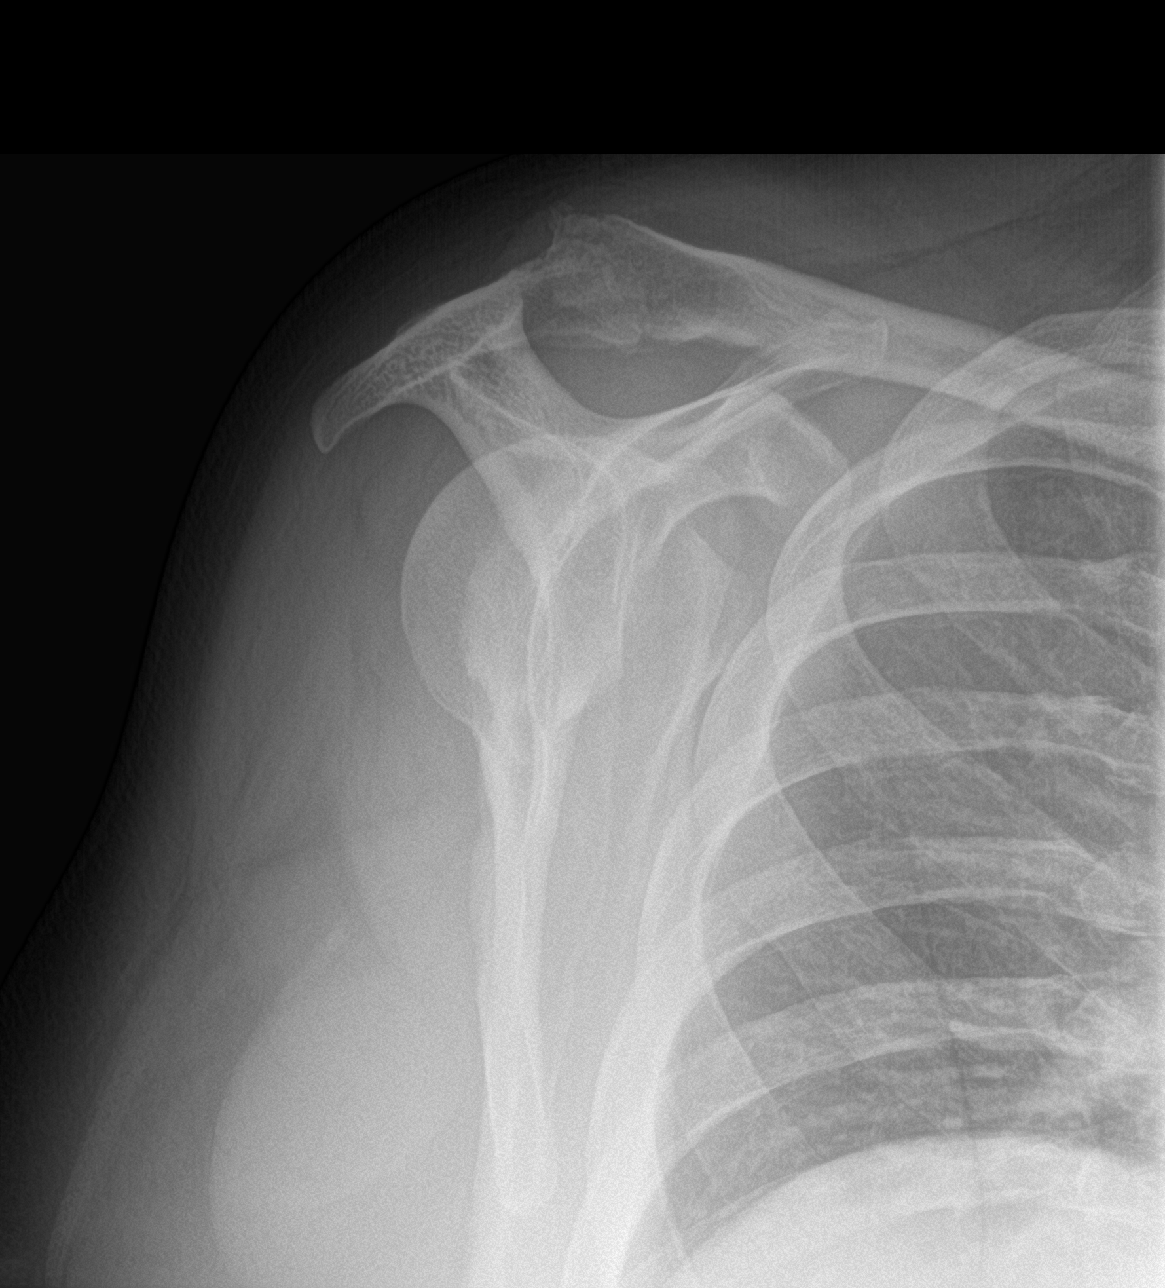

[shoulder axillary]
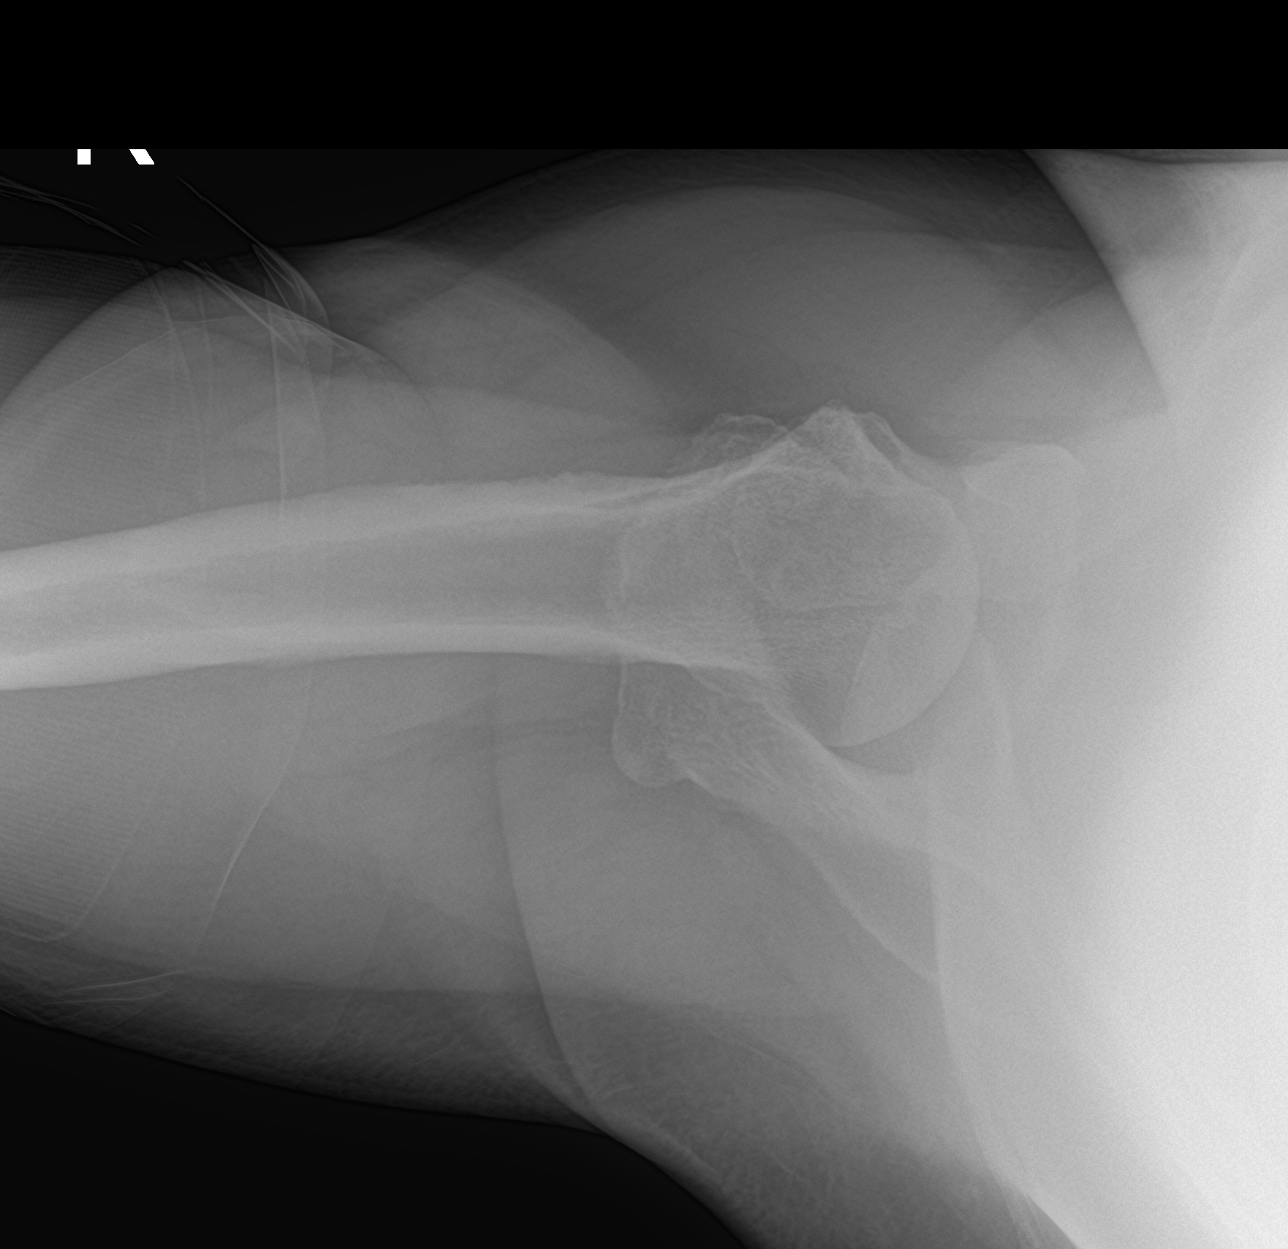

[3 of 3 positions shown; findings below may reference images not displayed]

FINDINGS: Normal alignment. No acute fracture. Moderate degenerative changes
of the AC joint. Normal mineralization. The soft tissues are
unremarkable.
IMPRESSION: No malalignment or acute fracture. Moderate degenerative changes of
the AC joint.

## 2023-11-30 HISTORY — PX: HIP ARTHROPLASTY: SHX981

## 2024-01-27 ENCOUNTER — Encounter: Admit: 2024-01-27 | Payer: PRIVATE HEALTH INSURANCE | Primary: Urology

## 2024-01-31 ENCOUNTER — Other Ambulatory Visit (HOSPITAL_BASED_OUTPATIENT_CLINIC_OR_DEPARTMENT_OTHER): Payer: Self-pay

## 2024-01-31 ENCOUNTER — Ambulatory Visit (INDEPENDENT_AMBULATORY_CARE_PROVIDER_SITE_OTHER)

## 2024-01-31 ENCOUNTER — Ambulatory Visit (INDEPENDENT_AMBULATORY_CARE_PROVIDER_SITE_OTHER): Payer: Medicare Other | Admitting: Podiatry

## 2024-01-31 DIAGNOSIS — M79671 Pain in right foot: Secondary | ICD-10-CM

## 2024-01-31 DIAGNOSIS — M7751 Other enthesopathy of right foot: Secondary | ICD-10-CM | POA: Diagnosis not present

## 2024-01-31 DIAGNOSIS — R52 Pain, unspecified: Secondary | ICD-10-CM | POA: Diagnosis not present

## 2024-01-31 DIAGNOSIS — M205X1 Other deformities of toe(s) (acquired), right foot: Secondary | ICD-10-CM | POA: Diagnosis not present

## 2024-01-31 MED ORDER — TRIAMCINOLONE ACETONIDE 10 MG/ML IJ SUSP
5.0000 mg | Freq: Once | INTRAMUSCULAR | Status: AC
  Administered 2024-01-31: 5 mg via INTRAMUSCULAR

## 2024-01-31 MED ORDER — DICLOFENAC SODIUM 1 % EX GEL
2.0000 g | Freq: Four times a day (QID) | CUTANEOUS | 2 refills | Status: AC
  Filled 2024-01-31: qty 100, 13d supply, fill #0

## 2024-01-31 NOTE — Patient Instructions (Signed)

## 2024-02-03 NOTE — Progress Notes (Signed)
 Subjective: Chief Complaint  Patient presents with   Toe Pain    RM#12 Right toe pain patient states has gotten worse   68 year old male presents the office with above concerns which is new.  Has been having pain in the right big toe for quite some time.  He has noticed some swelling localized to the area.  No injuries he reports.  Points on the first MTPJ where he has discomfort.  No recent treatment.  His lipids been doing great.  No other concerns.  Objective: AAO x3, NAD DP/PT pulses palpable bilaterally, CRT less than 3 seconds Tenderness to palpation of the right first MTPJ there is limitation of dorsiflexion there is localized edema to the joint there is no erythema unable to appreciate any area pinpoint tenderness.  There is no other areas of discomfort. No pain with calf compression, swelling, warmth, erythema  Assessment: Capsulitis, hallux limitus right foot  Plan: -All treatment options discussed with the patient including all alternatives, risks, complications.  -X-rays were obtained reviewed.  3 views of the foot obtained.  No evidence of acute fracture.  Arthritic changes present the first MTPJ.  Calcaneal spurring is noted.  Midfoot arthritic changes present. -Discussed steroid injection.  He would like to proceed with this.  Clean skin Betadine, alcohol.  A mixture of 1 cc Kenalog 10, 0.5 cc of Marcaine plain, 0.5 cc of lidocaine plain was infiltrated into the left first MTPJ without complications.  Postinjection care discussed.  Tolerated well.  -Discussed shoes, good arch support.  Discussed offloading.  If needed consider orthotics given the hallux limitus.  Long-term if symptoms continue, consider surgical intervention but I think we can manage this conservatively.  -Patient encouraged to call the office with any questions, concerns, change in symptoms.   Return if symptoms worsen or fail to improve.  Vivi Barrack DPM

## 2024-02-13 ENCOUNTER — Other Ambulatory Visit (HOSPITAL_BASED_OUTPATIENT_CLINIC_OR_DEPARTMENT_OTHER): Payer: Self-pay

## 2024-02-15 ENCOUNTER — Ambulatory Visit: Admit: 2024-02-15 | Payer: PRIVATE HEALTH INSURANCE | Attending: Urology | Primary: Urology

## 2024-05-30 ENCOUNTER — Ambulatory Visit: Admit: 2024-05-30 | Payer: PRIVATE HEALTH INSURANCE | Attending: Urology | Primary: Urology

## 2024-07-26 ENCOUNTER — Other Ambulatory Visit: Payer: Self-pay | Admitting: Urology

## 2024-08-08 ENCOUNTER — Encounter (HOSPITAL_COMMUNITY): Payer: Self-pay | Admitting: Urology

## 2024-08-10 NOTE — Progress Notes (Signed)
 Spoke w/ via phone for pre-op interview---[facility] Lab needs dos---- NONE         Lab results------ COVID test -----patient states asymptomatic no test needed Arrive at -------0730 NPO after MN NO Solid Food.   Pre-Surgery Ensure or G2:  Med rec completed Medications to take morning of surgery -----NONE Diabetic medication -----  GLP1 agonist last dose: GLP1 instructions:  Patient instructed no nail polish to be worn day of surgery Patient instructed to bring photo id and insurance card day of surgery Patient aware to have Driver (ride ) / caregiver    for 24 hours after surgery - Friend Bruna Setters Patient Special Instructions ----- Pre-Op special Instructions -----  Patient verbalized understanding of instructions that were given at this phone interview. Patient denies chest pain, sob, fever, cough at the interview.

## 2024-08-13 NOTE — H&P (Signed)
 CC/HPI: cc: BPH, ED, prostate cancer screening , urolithiasis   02/29/2024: 68 year old man with history of BPH managed with Uroxatrol and finasteride also with ED currently using daily tadalafil here for annual follow-up. He was also using Gemtesa however ran out of the medication and did not notice any change in symptoms. Patient's PSA has been low at 0.82 when checked last year. In the interim he had a hip replacement about 2-1/2 months ago. He has noticed some gross hematuria that occurs after exercise. He states he has a longstanding history of this. He has had a cystoscopy within the last 10 years. He has no family history of bladder or kidney cancer and no tobacco use. He does have a history of urolithiasis. Urinalysis today is normal.   07/18/2024: Patient seen today with concerns of possible kidney stone event. CT hematuria protocol study ordered after last exam showed a nonobstructing calculi along the right distal ureter measuring 5 x 8 mm, faintly seen on scout imaging, given the shape of the calculus there may be 2 separate calculi stacked on top of each other. There is also identified a large collection of nonobstructing calculi within the right renal collecting system measuring approximately 2 cm x 2 cm. Additional smaller nonobstructing renal calculi were seen bilaterally as well as some simple appearing renal cysts. Patient failed to follow-up for repeat evaluation with KUB. Creatinine drawn back at time of April's office visit was 1.4.   Shortly after his CT scan he passed 1 or 2 stones without any noted complication. The painless hematuria he was having with strenuous activity resolved. Now over the past several weeks he is having gross hematuria with any type of walking or running activity. Not associated with any pain in the right lower back or flank. No new or worsening LUTS including increased frequency/urgency or changes in force of stream. He does have a prior history of nephrolithiasis  and historically has been able to pass stones without any direct intervention. About 15 years ago he tells me he did undergo shockwave lithotripsy when he lived in [place] . He has never had ureteroscopy.     ALLERGIES: Pollen    MEDICATIONS: Alfuzosin HCl ER 10 MG Tablet Extended Release 24 Hour TAKE 1 TABLET BY MOUTH EVERY DAY  Finasteride 5 MG Tablet  Losartan Potassium  Lovastatin     GU PSH: Complex Uroflow - 2021 Locm 300-399Mg /Ml Iodine,1Ml - 04/02/2024     NON-GU PSH: Visit Complexity (formerly GPC1X) - 02/29/2024     GU PMH: BPH w/LUTS - 04/02/2024, - 02/29/2024, - 2024, - 2023, - 2022, Patient to continue Flomax 1 to 2 times a day for urinary symptoms. In addition he is going to try daily tadalafil to see if this helps his urinary symptoms and ED. Patient to follow-up in 4-6 weeks and will do uroflow/PVR at that time., - 2021 Gross hematuria - 04/02/2024, - 02/29/2024 ED due to arterial insufficiency - 02/29/2024, - 2024, - 2023, - 2022, Patient to try daily tadalafil. Follow-up in 4-6 weeks for reassessment., - 2021 Nocturia - 02/29/2024, - 2024, - 2023, Patient reminded he should stop drinking fluids 3 hours prior to bedtime., - 2021 Urinary Urgency - 02/29/2024, - 2024, - 2023, - 2022, - 2021 Weak Urinary Stream - 02/29/2024, - 2024, - 2023, - 2022 Encounter for Prostate Cancer screening (Stable) - 2024, DRE today was normal without nodules. Will send patient to the lab today for PSA check., - 2021    NON-GU PMH: Low testosterone -  2022, Check testosterone labs today and if low will discuss restarting exogenous testosterone., - 2021 Hypertension    FAMILY HISTORY: No Family History    SOCIAL HISTORY: Marital Status: Married Preferred Language: English; Ethnicity: Not Hispanic Or Latino; Race: White Current Smoking Status: Patient has never smoked.   Tobacco Use Assessment Completed: Used Tobacco in last 30 days? Drinks 1 drink per week.  Drinks 1 caffeinated drink per day.     REVIEW OF SYSTEMS:    GU Review Male:   Patient denies frequent urination, hard to postpone urination, burning/ pain with urination, get up at night to urinate, leakage of urine, stream starts and stops, trouble starting your stream, have to strain to urinate , erection problems, and penile pain.  Gastrointestinal (Upper):   Patient denies nausea, vomiting, and indigestion/ heartburn.  Gastrointestinal (Lower):   Patient denies diarrhea and constipation.  Constitutional:   Patient denies fever, night sweats, weight loss, and fatigue.  Skin:   Patient denies skin rash/ lesion and itching.  Eyes:   Patient denies blurred vision and double vision.  Ears/ Nose/ Throat:   Patient denies sore throat and sinus problems.  Hematologic/Lymphatic:   Patient denies swollen glands and easy bruising.  Cardiovascular:   Patient denies leg swelling and chest pains.  Respiratory:   Patient denies shortness of breath and cough.  Endocrine:   Patient denies excessive thirst.  Musculoskeletal:   Patient denies back pain and joint pain.  Neurological:   Patient denies headaches and dizziness.  Psychologic:   Patient denies depression and anxiety.   Notes: Pt stated he seen Dr. Elisabeth 3 mnths ago. Stated everytime he jobs he has KS, passed one, though they were gone but hes having hematuria.     VITAL SIGNS:      07/18/2024 02:06 PM  Weight 225 lb / 102.06 kg  Height 72 in / 182.88 cm  BP 124/82 mmHg  Pulse 89 /min  Temperature 97.7 F / 36.5 C  BMI 30.5 kg/m   MULTI-SYSTEM PHYSICAL EXAMINATION:    Constitutional: Well-nourished. No physical deformities. Normally developed. Good grooming.  Neck: Neck symmetrical, not swollen. Normal tracheal position.  Respiratory: No labored breathing, no use of accessory muscles.   Cardiovascular: Normal temperature, normal extremity pulses, no swelling, no varicosities.  Skin: No paleness, no jaundice, no cyanosis. No lesion, no ulcer, no rash.  Neurologic /  Psychiatric: Oriented to time, oriented to place, oriented to person. No depression, no anxiety, no agitation.  Musculoskeletal: Normal gait and station of head and neck.     Complexity of Data:  Source Of History:  Patient, Medical Record Summary  Lab Test Review:   PSA, BUN/Creatinine  Records Review:   Previous Doctor Records, Previous Hospital Records, Previous Patient Records  Urine Test Review:   Urinalysis  X-Ray Review: KUB: Reviewed Films. Discussed With Patient.  C.T. Hematuria: Reviewed Films. Reviewed Report. Discussed With Patient.     02/29/24 12/06/22 06/21/22 06/22/21 06/05/20  PSA  Total PSA 0.34 ng/mL 0.41 ng/mL 0.22 ng/mL 0.22 ng/mL 0.93 ng/mL    02/29/24  General Chemistry  BUN 22 mg/dL  Creatinine 1.4 mg/dL  eGFR African American 59.0   eGFR Non-Afr. American 50.9    PROCEDURES:         KUB - 74018  A single view of the abdomen is obtained. There is a 4 to 5 mm nonobstructing calculi visualized within the lower pole of the left renal shadow. There is a slightly larger stone which  appears outside of the upper pole area of the left renal shadow underneath the 11th rib which cannot complete excluded as a nonobstructing upper pole stone as well. I do not see an obvious PSA consistent with ureteral stone along the anatomical expected tract of the left ureter.  In the anatomical spector location of the right renal collecting system there continues to be a 2 cm sized opacity consistent with the previously identified calculus on prior CT imaging. Tracing along the anatomical expected tract of the right ureter the previously identified distal right ureteral calculi is not easily seen and correlates with patient's endorsed interval stone material passage. There are couple benign appearing pelvic opacities consistent with phleboliths noted within the lower pelvic inlet. Is a stable left hip prosthesis. Bladder gas appears free of obstruction.      . Patient confirmed No  Neulasta OnPro Device.           Urinalysis Dipstick Dipstick Cont'd  Color: Yellow Bilirubin: Neg mg/dL  Appearance: Clear Ketones: Neg mg/dL  Specific Gravity: 8.974 Blood: Neg ery/uL  pH: 5.5 Protein: Neg mg/dL  Glucose: Neg mg/dL Urobilinogen: 0.2 mg/dL    Nitrites: Neg    Leukocyte Esterase: Neg leu/uL    ASSESSMENT:      ICD-10 Details  1 GU:   Renal calculus - N20.0 Bilateral, Undiagnosed New Problem  2   Ureteral calculus - N20.1 Right, Undiagnosed New Problem, Resolved  3   Gross hematuria - R31.0 Chronic, Worsening   PLAN:           Orders Labs Urine Culture  X-Rays: KUB          Schedule Return Visit/Planned Activity: Next Available Appointment - Schedule Surgery          Document Letter(s):  Created for Patient: Clinical Summary         Notes:   Patient interested in repeat shockwave lithotripsy to treat his large stone burden easily identifiable on today's KUB study. The previously identified distal right ureteral calculi have since passed. Given the stone burden size, it may not completely fracture with initial shockwave treatment and required a second procedure. We also discussed the potential need for ureteroscopy should the stone burden fail to clear or he develops worsening symptoms of obstructive uropathy posttreatment. We did discuss that a staged ureteroscopy procedure may be a better option. I told him I would talk with Dr. Elisabeth to see what she would recommend for definitive treatment and then I will follow-up with him regarding those recommendations and beginning the scheduling process at that time.   *For shockwave lithotripsy I described the risks which include arrhythmia, kidney contusion, kidney hemorrhage, need for transfusion, long-term risk of diabetes or hypertension, back discomfort, flank ecchymosis, flank abrasion, inability to break up stone, inability to pass stone fragments, Steinstrasse, infection associated with obstructing stones, need for  different surgical procedure and possible need for repeat shockwave lithotripsy.   *For ureteroscopy I described the risks which include heart attack, stroke, pulmonary embolus, death, bleeding, infection, damage to contiguous structures, positioning injury, ureteral stricture, ureteral avulsion, ureteral injury, need for ureteral stent, inability to perform ureteroscopy, need for an interval procedure, inability to clear stone burden, stent discomfort and pain.

## 2024-08-14 ENCOUNTER — Encounter (HOSPITAL_COMMUNITY): Payer: Self-pay | Admitting: Anesthesiology

## 2024-08-14 ENCOUNTER — Ambulatory Visit (HOSPITAL_COMMUNITY): Admission: RE | Admit: 2024-08-14 | Source: Home / Self Care | Admitting: Urology

## 2024-08-14 HISTORY — DX: Personal history of urinary calculi: Z87.442

## 2024-08-14 SURGERY — CYSTOSCOPY/URETEROSCOPY/HOLMIUM LASER/STENT PLACEMENT
Anesthesia: General | Laterality: Right

## 2024-08-14 NOTE — Anesthesia Preprocedure Evaluation (Deleted)
 Anesthesia Evaluation    Reviewed: Allergy & Precautions, Patient's Chart, lab work & pertinent test results  History of Anesthesia Complications Negative for: history of anesthetic complications  Airway        Dental   Pulmonary neg pulmonary ROS          Cardiovascular hypertension, Pt. on medications      Neuro/Psych negative neurological ROS  negative psych ROS   GI/Hepatic negative GI ROS, Neg liver ROS,,,  Endo/Other  negative endocrine ROS    Renal/GU negative Renal ROS     Musculoskeletal negative musculoskeletal ROS (+)    Abdominal   Peds  Hematology  (+) REFUSES BLOOD PRODUCTS  Anesthesia Other Findings   Reproductive/Obstetrics                              Anesthesia Physical Anesthesia Plan  ASA: 2  Anesthesia Plan: General   Post-op Pain Management: Tylenol  PO (pre-op)*   Induction: Intravenous  PONV Risk Score and Plan: 2 and Treatment may vary due to age or medical condition, Ondansetron and Dexamethasone  Airway Management Planned: LMA  Additional Equipment: None  Intra-op Plan:   Post-operative Plan: Extubation in OR  Informed Consent:   Plan Discussed with: CRNA and Anesthesiologist  Anesthesia Plan Comments: (Patient did not show up)         Anesthesia Quick Evaluation

## 2024-08-15 ENCOUNTER — Other Ambulatory Visit: Payer: Self-pay | Admitting: Urology

## 2024-08-20 NOTE — Patient Instructions (Signed)
 SURGICAL WAITING ROOM VISITATION Patients having surgery or a procedure may have no more than 2 support people in the waiting area - these visitors may rotate in the visitor waiting room.   If the patient needs to stay at the hospital during part of their recovery, the visitor guidelines for inpatient rooms apply.  PRE-OP VISITATION  Pre-op nurse will coordinate an appropriate time for 1 support person to accompany the patient in pre-op.  This support person may not rotate.  This visitor will be contacted when the time is appropriate for the visitor to come back in the pre-op area.  Please refer to the [facility] website for the visitor guidelines for Inpatients (after your surgery is over and you are in a regular room).  You are not required to quarantine at this time prior to your surgery. However, you must do this: Hand Hygiene often Do NOT share personal items Notify your provider if you are in close contact with someone who has COVID or you develop fever 100.4 or greater, new onset of sneezing, cough, sore throat, shortness of breath or body aches.  If you test positive for Covid or have been in contact with anyone that has tested positive in the last 10 days please notify you surgeon.    Your procedure is scheduled on:  Thursday  August 23, 2024  Report to [facility] Main Entrance: [address] entrance where the [employer] is available.   Report to admitting at: 12:45  PM  Call this number if you have any questions or problems the morning of surgery [phone]  DO NOT EAT OR DRINK ANYTHING AFTER MIDNIGHT THE NIGHT PRIOR TO YOUR SURGERY / PROCEDURE.   FOLLOW  ANY ADDITIONAL PRE OP INSTRUCTIONS YOU RECEIVED FROM YOUR SURGEON'S OFFICE!!!   Oral Hygiene is also important to reduce your risk of infection.        Remember - BRUSH YOUR TEETH THE MORNING OF SURGERY WITH YOUR REGULAR TOOTHPASTE  Do NOT smoke after Midnight the night before surgery.  STOP TAKING all  Vitamins, Herbs and supplements 1 week before your surgery.   ASPIRIN- Stop taking 5 days before your surgery,  Last dose would have been taken on  08-17-24  Friday.   Take ONLY these medicines the morning of surgery with A SIP OF WATER: none  DO NOT TAKE LOSARTAN the morning of your surgery.                     You may not have any metal on your body including  jewelry, and body piercing  Do not wear  lotions, powders, cologne, or deodorant  Men may shave face and neck.  Contacts, Hearing Aids, dentures or bridgework may not be worn into surgery. DENTURES WILL BE REMOVED PRIOR TO SURGERY PLEASE DO NOT APPLY Poly grip OR ADHESIVES!!!  Patients discharged on the day of surgery will not be allowed to drive home.  Someone NEEDS to stay with you for the first 24 hours after anesthesia.  Do not bring your home medications to the hospital. The Pharmacy will dispense medications listed on your medication list to you during your admission in the Hospital.  Please read over the following fact sheets you were given: IF YOU HAVE QUESTIONS ABOUT YOUR PRE-OP INSTRUCTIONS, PLEASE CALL [phone].   [place] - Preparing for Surgery Before surgery, you can play an important role.  Because skin is not sterile, your skin needs to be as free of germs  as possible.  You can reduce the number of germs on your skin by washing with CHG (chlorahexidine gluconate) soap before surgery.  CHG is an antiseptic cleaner which kills germs and bonds with the skin to continue killing germs even after washing. Please DO NOT use if you have an allergy to CHG or antibacterial soaps.  If your skin becomes reddened/irritated stop using the CHG and inform your nurse when you arrive at Short Stay. Do not shave (including legs and underarms) for at least 48 hours prior to the first CHG shower.  You may shave your face/neck.  Please follow these instructions carefully:  1.  Shower with CHG Soap the night before surgery and  the  morning of surgery.  2.  If you choose to wash your hair, wash your hair first as usual with your normal  shampoo.  3.  After you shampoo, rinse your hair and body thoroughly to remove the shampoo.                             4.  Use CHG as you would any other liquid soap.  You can apply chg directly to the skin and wash.  Gently with a scrungie or clean washcloth.  5.  Apply the CHG Soap to your body ONLY FROM THE NECK DOWN.   Do not use on face/ open                           Wound or open sores. Avoid contact with eyes, ears mouth and genitals (private parts).                       Wash face,  Genitals (private parts) with your normal soap.             6.  Wash thoroughly, paying special attention to the area where your  surgery  will be performed.  7.  Thoroughly rinse your body with warm water from the neck down.  8.  DO NOT shower/wash with your normal soap after using and rinsing off the CHG Soap.            9.  Pat yourself dry with a clean towel.            10.  Wear clean pajamas.            11.  Place clean sheets on your bed the night of your first shower and do not  sleep with pets.  ON THE DAY OF SURGERY : Do not apply any lotions/deodorants the morning of surgery.  Please wear clean clothes to the hospital/surgery center.    FAILURE TO FOLLOW THESE INSTRUCTIONS MAY RESULT IN THE CANCELLATION OF YOUR SURGERY  PATIENT SIGNATURE_________________________________  NURSE SIGNATURE__________________________________  ________________________________________________________________________

## 2024-08-20 NOTE — Progress Notes (Signed)
 Surgery cancelled

## 2024-08-22 ENCOUNTER — Encounter (HOSPITAL_COMMUNITY)
Admission: RE | Admit: 2024-08-22 | Discharge: 2024-08-22 | Disposition: A | Discharge status: Home / Self Care | Source: Ambulatory Visit | Attending: Anesthesiology | Admitting: Anesthesiology

## 2024-08-22 ENCOUNTER — Encounter (HOSPITAL_COMMUNITY): Payer: Self-pay

## 2024-08-22 DIAGNOSIS — I10 Essential (primary) hypertension: Secondary | ICD-10-CM

## 2024-08-22 DIAGNOSIS — Z01818 Encounter for other preprocedural examination: Secondary | ICD-10-CM

## 2024-08-23 ENCOUNTER — Encounter (HOSPITAL_COMMUNITY): Admission: RE | Payer: Self-pay | Source: Home / Self Care

## 2024-08-23 ENCOUNTER — Ambulatory Visit (HOSPITAL_COMMUNITY): Admission: RE | Admit: 2024-08-23 | Source: Home / Self Care | Admitting: Urology

## 2024-08-23 SURGERY — CYSTOSCOPY/URETEROSCOPY/HOLMIUM LASER/STENT PLACEMENT
Anesthesia: General | Laterality: Right

## 2024-11-05 ENCOUNTER — Other Ambulatory Visit: Payer: Self-pay | Admitting: Urology

## 2024-11-23 NOTE — Patient Instructions (Addendum)
 SURGICAL WAITING ROOM VISITATION Patients having surgery or a procedure may have no more than 2 support people in the waiting area - these visitors may rotate.    Children under the age of 45 will not be allowed to visit due to the increase in respiratory illness  Children under the age of 44 must have an adult with them who is not the patient.  If the patient needs to stay at the hospital during part of their recovery, the visitor guidelines for inpatient rooms apply. Pre-op nurse will coordinate an appropriate time for 1 support person to accompany patient in pre-op.  This support person may not rotate.    Please refer to the [facility] website for the visitor guidelines for Inpatients (after your surgery is over and you are in a regular room).       Your procedure is scheduled on: 12-11-24   Report to [facility] Main Entrance    Report to admitting at 7:45 AM   Call this number if you have problems the morning of surgery [phone]   Do not eat food or drink liquids :After Midnight.           If you have questions, please contact your surgeons office.   FOLLOW ANY ADDITIONAL PRE OP INSTRUCTIONS YOU RECEIVED FROM YOUR SURGEON'S OFFICE!!!     Oral Hygiene is also important to reduce your risk of infection.                                    Remember - BRUSH YOUR TEETH THE MORNING OF SURGERY WITH YOUR REGULAR TOOTHPASTE   Do NOT smoke after Midnight   Take these medicines the morning of surgery with A SIP OF WATER:    Alfuzosin   Finasteride   Zyrtec  Stop all vitamins and herbal supplements 7 days before surgery                              You may not have any metal on your body including  jewelry, and body piercing             Do not wear lotion, powder, cologne or deodorant              Men may shave face and neck.   Do not bring valuables to the hospital. [facility] IS NOT RESPONSIBLE   FOR VALUABLES.   Contacts, dentures or bridgework may not be  worn into surgery.   DO NOT BRING YOUR HOME MEDICATIONS TO THE HOSPITAL. PHARMACY WILL DISPENSE MEDICATIONS LISTED ON YOUR MEDICATION LIST TO YOU DURING YOUR ADMISSION IN THE HOSPITAL!    Patients discharged on the day of surgery will not be allowed to drive home.  Someone NEEDS to stay with you for the first 24 hours after anesthesia.               Please read over the following fact sheets you were given: IF YOU HAVE QUESTIONS ABOUT YOUR PRE-OP INSTRUCTIONS PLEASE CALL [phone] [place]  If you received a COVID test during your pre-op visit  it is requested that you wear a mask when out in public, stay away from anyone that may not be feeling well and notify your surgeon if you develop symptoms. If you test positive for Covid or have been in contact with anyone that has tested positive  in the last 10 days please notify you surgeon.  [facility] - Preparing for Surgery Before surgery, you can play an important role.  Because skin is not sterile, your skin needs to be as free of germs as possible.  You can reduce the number of germs on your skin by washing with CHG (chlorahexidine gluconate) soap before surgery.  CHG is an antiseptic cleaner which kills germs and bonds with the skin to continue killing germs even after washing. Please DO NOT use if you have an allergy to CHG or antibacterial soaps.  If your skin becomes reddened/irritated stop using the CHG and inform your nurse when you arrive at Short Stay. Do not shave (including legs and underarms) for at least 48 hours prior to the first CHG shower.  You may shave your face/neck.  Please follow these instructions carefully:  1.  Shower with CHG Soap the night before surgery and the  morning of surgery.  2.  If you choose to wash your hair, wash your hair first as usual with your normal  shampoo.  3.  After you shampoo, rinse your hair and body thoroughly to remove the shampoo.                             4.  Use CHG as you would any other  liquid soap.  You can apply chg directly to the skin and wash.  Gently with a scrungie or clean washcloth.  5.  Apply the CHG Soap to your body ONLY FROM THE NECK DOWN.   Do   not use on face/ open                           Wound or open sores. Avoid contact with eyes, ears mouth and   genitals (private parts).                       Wash face,  Genitals (private parts) with your normal soap.             6.  Wash thoroughly, paying special attention to the area where your    surgery  will be performed.  7.  Thoroughly rinse your body with warm water from the neck down.  8.  DO NOT shower/wash with your normal soap after using and rinsing off the CHG Soap.                9.  Pat yourself dry with a clean towel.            10.  Wear clean pajamas.            11.  Place clean sheets on your bed the night of your first shower and do not  sleep with pets. Day of Surgery : Do not apply any lotions/deodorants the morning of surgery.  Please wear clean clothes to the hospital/surgery center.  FAILURE TO FOLLOW THESE INSTRUCTIONS MAY RESULT IN THE CANCELLATION OF YOUR SURGERY  PATIENT SIGNATURE_________________________________  NURSE SIGNATURE__________________________________  ________________________________________________________________________

## 2024-11-23 NOTE — Progress Notes (Addendum)
 Date of COVID positive in last 90 days:  No  PCP - [facility] in [place], [place]  Cardiologist - Warren Police (has not seen recently)  Chest x-ray - N/A EKG - 11-27-24 Epic Stress Test - 07-04-15 [facility] ECHO - 01-28-16 [facility] Cardiac Cath - N/A Holter Monitor - 2014 [facility] Pacemaker/ICD device last checked:N/A Spinal Cord Stimulator:N/A  Bowel Prep - N/A  Sleep Study - N/A CPAP -   Fasting Blood Sugar - N/A Checks Blood Sugar _____ times a day  Last dose of GLP1 agonist-  N/A GLP1 instructions:  Do not take after     Last dose of SGLT-2 inhibitors-  N/A SGLT-2 instructions:  Do not take after    Blood Thinner Instructions: N/A Last dose:   Time: Aspirin Instructions:  ASA 81 (pt states was not told to hold) Last Dose:  Activity level:  Can go up a flight of stairs and perform activities of daily living without stopping and without symptoms of chest pain or shortness of breath.  Anesthesia review: Cardiology for HTN and aorta enlargement  Patient denies shortness of breath, fever, cough and chest pain at PAT appointment  Patient verbalized understanding of instructions that were given to them at the PAT appointment. Patient was also instructed that they will need to review over the PAT instructions again at home before surgery.

## 2024-11-27 ENCOUNTER — Encounter (HOSPITAL_COMMUNITY): Payer: Self-pay

## 2024-11-27 ENCOUNTER — Other Ambulatory Visit: Payer: Self-pay

## 2024-11-27 ENCOUNTER — Encounter (HOSPITAL_COMMUNITY)
Admission: RE | Admit: 2024-11-27 | Discharge: 2024-11-27 | Disposition: A | Discharge status: Home / Self Care | Source: Ambulatory Visit | Attending: Urology | Admitting: Urology

## 2024-11-27 VITALS — BP 123/86 | HR 88 | Temp 97.9°F | Resp 16 | Ht 72.0 in | Wt 240.2 lb

## 2024-11-27 DIAGNOSIS — N200 Calculus of kidney: Secondary | ICD-10-CM | POA: Diagnosis not present

## 2024-11-27 DIAGNOSIS — Z01818 Encounter for other preprocedural examination: Secondary | ICD-10-CM | POA: Insufficient documentation

## 2024-11-27 DIAGNOSIS — I7121 Aneurysm of the ascending aorta, without rupture: Secondary | ICD-10-CM | POA: Diagnosis not present

## 2024-11-27 DIAGNOSIS — I10 Essential (primary) hypertension: Secondary | ICD-10-CM | POA: Diagnosis not present

## 2024-11-27 DIAGNOSIS — I2510 Atherosclerotic heart disease of native coronary artery without angina pectoris: Secondary | ICD-10-CM | POA: Insufficient documentation

## 2024-11-27 HISTORY — DX: Unspecified malignant neoplasm of skin, unspecified: C44.90

## 2024-11-27 LAB — BASIC METABOLIC PANEL WITH GFR
Anion gap: 12 (ref 5–15)
BUN: 25 mg/dL — ABNORMAL HIGH (ref 8–23)
CO2: 21 mmol/L — ABNORMAL LOW (ref 22–32)
Calcium: 9.2 mg/dL (ref 8.9–10.3)
Chloride: 108 mmol/L (ref 98–111)
Creatinine, Ser: 1.29 mg/dL — ABNORMAL HIGH (ref 0.61–1.24)
GFR, Estimated: >60 mL/min
Glucose, Bld: 91 mg/dL (ref 70–99)
Potassium: 4.2 mmol/L (ref 3.5–5.1)
Sodium: 141 mmol/L (ref 135–145)

## 2024-12-03 NOTE — Anesthesia Preprocedure Evaluation (Addendum)
"                                    Anesthesia Evaluation  Patient identified by MRN, date of birth, ID band Patient awake    Reviewed: Allergy & Precautions, NPO status , Patient's Chart, lab work & pertinent test results  Airway Mallampati: I  TM Distance: >3 FB Neck ROM: Full    Dental no notable dental hx.    Pulmonary neg pulmonary ROS   Pulmonary exam normal        Cardiovascular hypertension, Normal cardiovascular exam     Neuro/Psych negative neurological ROS  negative psych ROS   GI/Hepatic negative GI ROS, Neg liver ROS,,,  Endo/Other  Patient on GLP-1 Agonist  Renal/GU Renal diseasestones     Musculoskeletal negative musculoskeletal ROS (+)    Abdominal  (+) + obese  Peds  Hematology  (+) REFUSES BLOOD PRODUCTS  Anesthesia Other Findings RIGHT KIDNEY STONE  Reproductive/Obstetrics                              Anesthesia Physical Anesthesia Plan  ASA: 3  Anesthesia Plan: General   Post-op Pain Management: Tylenol  PO (pre-op)*   Induction: Intravenous  PONV Risk Score and Plan: 2 and Ondansetron , Dexamethasone , Midazolam  and Treatment may vary due to age or medical condition  Airway Management Planned: Oral ETT  Additional Equipment:   Intra-op Plan:   Post-operative Plan: Extubation in OR  Informed Consent: I have reviewed the patients History and Physical, chart, labs and discussed the procedure including the risks, benefits and alternatives for the proposed anesthesia with the patient or authorized representative who has indicated his/her understanding and acceptance.     Dental advisory given  Plan Discussed with: CRNA  Anesthesia Plan Comments: (PAT note from 12/30)         Anesthesia Quick Evaluation  "

## 2024-12-03 NOTE — Progress Notes (Signed)
"   Case: [id number] Date/Time: 12/11/24 0945   Procedure: CYSTOSCOPY/URETEROSCOPY/HOLMIUM LASER/STENT PLACEMENT (Right) - CYSTOSCOPY/RIGHT URETEROSCOPY/HOLMIUM LASER/STENT PLACEMENT   Anesthesia type: General   Diagnosis: Kidney stone [N20.0]   Pre-op diagnosis: RIGHT KIDNEY STONE   Location: [facility] ROOM 03 / [facility]   Surgeons: Elisabeth Valli BIRCH, MD       DISCUSSION: John Hammond is a 69 yo male with PMH of HTN, CAD (by imaging), TAA, kidney stones.  Patient previously followed with Cardiology at [facility]. Last seen in 2023 by Dr. Asberry Gunning to re-establish cardiac care. He last had his aorta imaged in 2018 and it was estimated to be 4.4cm by CTA. An echo was ordered at that visit but not completed and he has since been lost to f/u.  Last surgery was a L THA in 10/2023 at [facility] in [place] .   Last seen by PCP on 12/12/23 in [place] . All issues stable at that visit.  At PAT appt patient reports he can do stairs without CP/SOB. Blood pressure is normal. Discussed with Dr. Keneth due to no ongoing follow up and patient is ok to proceed.  VS: BP 123/86   Pulse 88   Temp 36.6 C (Oral)   Resp 16   Ht 6' (1.829 m)   Wt 109 kg   SpO2 98%   BMI 32.58 kg/m   PROVIDERS: Ara Jenevizian ([facility] [place] )   LABS: Labs reviewed: Acceptable for surgery. (all labs ordered are listed, but only abnormal results are displayed)  Labs Reviewed  BASIC METABOLIC PANEL WITH GFR - Abnormal; Notable for the following components:      Result Value   CO2 21 (*)    BUN 25 (*)    Creatinine, Ser 1.29 (*)    All other components within normal limits    CTA Chest 12/21/2016 [facility]):  Impression IMPRESSION: Not significantly changed 44 mm ascending aortic aneurysm.  EKG 11/27/24:  NSR Low voltage QRS   Echo 01/29/2016 [facility]):  Narrative * Limited Study to assess Aortic dimensionsTrileaflet aortic valve with trace  regurgitationDilated aortic root with effacement of the Sino-tubular junction and dilated ascending aortaAscending aorta 4.6-4.7 cm or 2.0 cm/m2 BSAIncrease in ascending aortic maximal dimension compared to the echocardiogram of August 2016 (4.2 cm at that time).Overall LVEF appears preserved with fair endocardial resolution; no Definity contrast employed.  Past Medical History:  Diagnosis Date   History of kidney stones    Hyperlipidemia    Hypertension    Renal disorder 05/15/2012   kidney stones   Skin cancer     Past Surgical History:  Procedure Laterality Date   COLONOSCOPY  01/18/2023   HIP ARTHROPLASTY Left 2025   kidney stones     LITHOTRIPSY     SKIN CANCER EXCISION      MEDICATIONS:  alfuzosin (UROXATRAL) 10 MG 24 hr tablet   aspirin EC 81 MG tablet   cetirizine (ZYRTEC) 10 MG chewable tablet   diclofenac  Sodium (VOLTAREN ) 1 % GEL   finasteride (PROSCAR) 5 MG tablet   Multiple Vitamin (MULTIVITAMIN WITH MINERALS) TABS tablet   No current facility-administered medications for this encounter.   Burnard CHRISTELLA Odis DEVONNA [facility] Short Stay/Anesthesiology [facility] Phone [phone] 12/03/2024 12:17 PM        "

## 2024-12-06 ENCOUNTER — Institutional Professional Consult (permissible substitution): Admitting: Family Medicine

## 2024-12-11 ENCOUNTER — Encounter (HOSPITAL_COMMUNITY): Payer: Self-pay | Admitting: Urology

## 2024-12-11 ENCOUNTER — Ambulatory Visit (HOSPITAL_COMMUNITY)

## 2024-12-11 ENCOUNTER — Ambulatory Visit (HOSPITAL_COMMUNITY): Payer: Self-pay | Admitting: Anesthesiology

## 2024-12-11 ENCOUNTER — Ambulatory Visit (HOSPITAL_COMMUNITY)
Admission: RE | Admit: 2024-12-11 | Discharge: 2024-12-11 | Disposition: A | Discharge status: Home / Self Care | Source: Ambulatory Visit | Attending: Urology | Admitting: Urology

## 2024-12-11 ENCOUNTER — Other Ambulatory Visit: Payer: Self-pay

## 2024-12-11 ENCOUNTER — Encounter (HOSPITAL_COMMUNITY)
Admission: RE | Disposition: A | Discharge status: Home / Self Care | Payer: Self-pay | Source: Ambulatory Visit | Attending: Urology

## 2024-12-11 DIAGNOSIS — N200 Calculus of kidney: Secondary | ICD-10-CM | POA: Insufficient documentation

## 2024-12-11 HISTORY — PX: CYSTOSCOPY/URETEROSCOPY/HOLMIUM LASER/STENT PLACEMENT: SHX6546

## 2024-12-11 MED ORDER — FENTANYL CITRATE (PF) 50 MCG/ML IJ SOSY: 25.0000 ug | PREFILLED_SYRINGE | INTRAMUSCULAR | Status: DC | PRN

## 2024-12-11 MED ORDER — MIDAZOLAM HCL 5 MG/5ML IJ SOLN
INTRAMUSCULAR | Status: DC | PRN
  Administered 2024-12-11: 1 mg via INTRAVENOUS

## 2024-12-11 MED ORDER — EPHEDRINE SULFATE (PRESSORS) 25 MG/5ML IV SOSY
PREFILLED_SYRINGE | INTRAVENOUS | Status: DC | PRN
  Administered 2024-12-11: 10 mg via INTRAVENOUS
  Administered 2024-12-11: 5 mg via INTRAVENOUS
  Administered 2024-12-11: 10 mg via INTRAVENOUS

## 2024-12-11 MED ORDER — CEFAZOLIN SODIUM-DEXTROSE 2-4 GM/100ML-% IV SOLN
2.0000 g | INTRAVENOUS | Status: AC
  Administered 2024-12-11: 2 g via INTRAVENOUS
  Filled 2024-12-11: qty 100

## 2024-12-11 MED ORDER — PROPOFOL 10 MG/ML IV BOLUS
INTRAVENOUS | Status: AC
  Filled 2024-12-11: qty 20

## 2024-12-11 MED ORDER — MIDAZOLAM HCL 2 MG/2ML IJ SOLN
INTRAMUSCULAR | Status: AC
  Filled 2024-12-11: qty 2

## 2024-12-11 MED ORDER — LACTATED RINGERS IV SOLN: INTRAVENOUS | Status: DC

## 2024-12-11 MED ORDER — KETOROLAC TROMETHAMINE 15 MG/ML IJ SOLN
INTRAMUSCULAR | Status: AC
  Filled 2024-12-11: qty 1

## 2024-12-11 MED ORDER — FENTANYL CITRATE (PF) 100 MCG/2ML IJ SOLN
INTRAMUSCULAR | Status: DC | PRN
  Administered 2024-12-11: 100 ug via INTRAVENOUS

## 2024-12-11 MED ORDER — ACETAMINOPHEN 500 MG PO TABS
1000.0000 mg | ORAL_TABLET | Freq: Once | ORAL | Status: AC
  Administered 2024-12-11: 1000 mg via ORAL
  Filled 2024-12-11: qty 2

## 2024-12-11 MED ORDER — SUGAMMADEX SODIUM 200 MG/2ML IV SOLN
INTRAVENOUS | Status: AC
  Filled 2024-12-11: qty 2

## 2024-12-11 MED ORDER — IOHEXOL 300 MG/ML  SOLN
INTRAMUSCULAR | Status: DC | PRN
  Administered 2024-12-11: 20 mL

## 2024-12-11 MED ORDER — EPHEDRINE 5 MG/ML INJ
INTRAVENOUS | Status: AC
  Filled 2024-12-11: qty 5

## 2024-12-11 MED ORDER — CHLORHEXIDINE GLUCONATE 0.12 % MT SOLN
15.0000 mL | Freq: Once | OROMUCOSAL | Status: AC
  Administered 2024-12-11: 15 mL via OROMUCOSAL

## 2024-12-11 MED ORDER — DEXAMETHASONE SODIUM PHOSPHATE 4 MG/ML IJ SOLN
INTRAMUSCULAR | Status: DC | PRN
  Administered 2024-12-11: 10 mg via INTRAVENOUS

## 2024-12-11 MED ORDER — SODIUM CHLORIDE 0.9 % IR SOLN
Status: DC | PRN
  Administered 2024-12-11: 3000 mL via INTRAVESICAL

## 2024-12-11 MED ORDER — ROCURONIUM BROMIDE 100 MG/10ML IV SOLN
INTRAVENOUS | Status: DC | PRN
  Administered 2024-12-11: 10 mg via INTRAVENOUS
  Administered 2024-12-11: 50 mg via INTRAVENOUS
  Administered 2024-12-11: 10 mg via INTRAVENOUS

## 2024-12-11 MED ORDER — AMISULPRIDE (ANTIEMETIC) 5 MG/2ML IV SOLN: 10.0000 mg | Freq: Once | INTRAVENOUS | Status: DC | PRN

## 2024-12-11 MED ORDER — ORAL CARE MOUTH RINSE: 15.0000 mL | Freq: Once | OROMUCOSAL | Status: AC

## 2024-12-11 MED ORDER — FENTANYL CITRATE (PF) 100 MCG/2ML IJ SOLN
INTRAMUSCULAR | Status: AC
  Filled 2024-12-11: qty 2

## 2024-12-11 MED ORDER — KETOROLAC TROMETHAMINE 15 MG/ML IJ SOLN
15.0000 mg | Freq: Once | INTRAMUSCULAR | Status: AC | PRN
  Administered 2024-12-11: 15 mg via INTRAVENOUS

## 2024-12-11 MED ORDER — ONDANSETRON HCL 4 MG/2ML IJ SOLN
INTRAMUSCULAR | Status: DC | PRN
  Administered 2024-12-11: 4 mg via INTRAVENOUS

## 2024-12-11 MED ORDER — PROPOFOL 10 MG/ML IV BOLUS
INTRAVENOUS | Status: DC | PRN
  Administered 2024-12-11: 200 mg via INTRAVENOUS

## 2024-12-11 MED ORDER — SUGAMMADEX SODIUM 200 MG/2ML IV SOLN
INTRAVENOUS | Status: DC | PRN
  Administered 2024-12-11: 200 mg via INTRAVENOUS

## 2024-12-11 MED ORDER — TRAMADOL HCL 50 MG PO TABS: 50.0000 mg | ORAL_TABLET | Freq: Four times a day (QID) | ORAL | 0 refills | Status: AC | PRN

## 2024-12-11 MED ORDER — LIDOCAINE HCL (CARDIAC) PF 100 MG/5ML IV SOSY
PREFILLED_SYRINGE | INTRAVENOUS | Status: DC | PRN
  Administered 2024-12-11: 60 mg via INTRAVENOUS

## 2024-12-11 MED ORDER — ONDANSETRON HCL 4 MG/2ML IJ SOLN: 4.0000 mg | Freq: Once | INTRAMUSCULAR | Status: DC | PRN

## 2024-12-11 NOTE — Interval H&P Note (Signed)
 History and Physical Interval Note:  12/11/2024 9:17 AM  John Hammond  has presented today for surgery, with the diagnosis of RIGHT KIDNEY STONE.  The various methods of treatment have been discussed with the patient and family. After consideration of risks, benefits and other options for treatment, the patient has consented to  Procedures with comments: CYSTOSCOPY/URETEROSCOPY/HOLMIUM LASER/STENT PLACEMENT (Right) - CYSTOSCOPY/RIGHT URETEROSCOPY/HOLMIUM LASER/STENT PLACEMENT as a surgical intervention.  The patient's history has been reviewed, patient examined, no change in status, stable for surgery.  I have reviewed the patient's chart and labs.  Questions were answered to the patient's satisfaction.     John Hammond

## 2024-12-11 NOTE — Op Note (Signed)
 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 Bladder mucosa normal without masses   EBL: Minimal  Specimens: right renal calculus  Disposition of specimens: [facility] for stone analysis  Indication: John Hammond is a 69 y.o.   patient with a 15mm right renal stone and associated right symptoms. After reviewing the management options for treatment, the patient elected to proceed with the above surgical procedure(s). We have discussed the potential benefits and risks of the procedure, side effects of the proposed treatment, the likelihood of the patient achieving the goals of the procedure, and any potential problems that might occur during the procedure or recuperation. Informed consent has been obtained.   Description of procedure:  The patient was taken to the operating room and general anesthesia was induced.  The patient was placed in the dorsal lithotomy position, prepped and draped in the usual sterile fashion, and preoperative antibiotics were administered. A preoperative time-out was performed.   Cystourethroscopy was performed.  The patients urethra was examined and demonstrated bilobar prostatic hypertrophy with a median lobe. The bladder was then systematically examined in its entirety. There was no evidence for any bladder tumors, stones, or  other mucosal pathology.    Attention then turned to the right ureteral orifice and a ureteral catheter was used to intubate the ureteral orifice.  Omnipaque  contrast was injected through the ureteral catheter and a retrograde pyelogram was performed with findings as dictated above.  A 0.38 sensor guidewire was then advanced up the right ureter into the renal pelvis under fluoroscopic guidance. A second sensor wire was advanced alongside the first sensor wre and up to the kidney with fluoroscopic guidance.  One wire was secured as the safety wire. The ureteral access sheath was placed over the second wire and advanced to the proximal ureter with fluoroscopic guidance.  The inner sheath and wire were removed.  Flexible ureteroscopy took place and 2 large stones were seen in the renal pelvis. The stones was then fragmented with the 200 micron holmium laser fiber. A zero  tip basket was then used to remove some of the stone fragments. There was significant residual stone burden noted.  The wire was then backloaded through the cystoscope and a ureteral stent was advance over the wire using Seldinger technique.  The stent was positioned appropriately under fluoroscopic and cystoscopic guidance.  The wire was then removed with an adequate stent curl noted in the renal pelvis as well as in the bladder.  The bladder was then emptied and the procedure ended.  The patient appeared to tolerate the procedure well and without complications.  The patient was able to be awakened and transferred to the recovery unit in satisfactory condition.   Disposition: No string was left. He will need KUB and likely secondary procedure due to increased stone burden seen today.

## 2024-12-11 NOTE — Transfer of Care (Signed)
 Immediate Anesthesia Transfer of Care Note  Patient: John Hammond  Procedure(s) Performed: CYSTOSCOPY/URETEROSCOPY/HOLMIUM LASER/STENT PLACEMENT (Right)  Patient Location: PACU  Anesthesia Type:General  Level of Consciousness: sedated  Airway & Oxygen Therapy: Patient Spontanous Breathing and Patient connected to face mask oxygen  Post-op Assessment: Report given to RN and Post -op Vital signs reviewed and stable  Post vital signs: Reviewed and stable  Last Vitals:  Vitals Value Taken Time  BP    Temp    Pulse 83 12/11/24 11:25  Resp 22 12/11/24 11:25  SpO2 99 % 12/11/24 11:25  Vitals shown include unfiled device data.  Last Pain:  Vitals:   12/11/24 0821  TempSrc:   PainSc: 0-No pain      Patients Stated Pain Goal: 5 (12/11/24 0755)  Complications: No notable events documented.

## 2024-12-11 NOTE — Discharge Instructions (Addendum)
 DISCHARGE INSTRUCTIONS FOR KIDNEY STONE/URETERAL STENT   MEDICATIONS:  1. Resume all your other meds from home  2. AZO over the counter can help with the burning/stinging when you urinate. 3. Tramadol  is for moderate/severe pain, otherwise taking up to 1000 mg every 6 hours of plain Tylenol  will help treat your pain.       ACTIVITY:  1. No strenuous activity x 1week  2. No driving while on narcotic pain medications  3. Drink plenty of water  4. Continue to walk at home - you can still get blood clots when you are at home, so keep active, but don't over do it.  5. May return to work/school tomorrow or when you feel ready   BATHING:  1. You can shower and we recommend daily showers    SIGNS/SYMPTOMS TO CALL:  Please call us  if you have a fever greater than 101.5, uncontrolled nausea/vomiting, uncontrolled pain, dizziness, unable to urinate, bloody urine, chest pain, shortness of breath, leg swelling, leg pain, redness around wound, drainage from wound, or any other concerns or questions.   You can reach us  at [phone].   FOLLOW-UP:  1. You will have a follow up in the office with an xray.

## 2024-12-11 NOTE — Anesthesia Procedure Notes (Signed)
 Procedure Name: Intubation Date/Time: 12/11/2024 10:26 AM  Performed by: Vincenzo Show, CRNAPre-anesthesia Checklist: Patient identified, Emergency Drugs available, Suction available, Patient being monitored and Timeout performed Patient Re-evaluated:Patient Re-evaluated prior to induction Oxygen Delivery Method: Circle system utilized Preoxygenation: Pre-oxygenation with 100% oxygen Induction Type: IV induction Ventilation: Mask ventilation without difficulty and Oral airway inserted - appropriate to patient size Laryngoscope Size: Mac and 4 Grade View: Grade II Tube type: Oral Tube size: 7.0 mm Number of attempts: 1 Airway Equipment and Method: Stylet Placement Confirmation: ETT inserted through vocal cords under direct vision, positive ETCO2, CO2 detector and breath sounds checked- equal and bilateral Secured at: 23 cm Tube secured with: Tape Dental Injury: Teeth and Oropharynx as per pre-operative assessment  Comments: ATOI, pt took GLP 2 days ago, no gastric contents noted in airway or OGT.

## 2024-12-12 ENCOUNTER — Encounter (HOSPITAL_COMMUNITY): Payer: Self-pay | Admitting: Urology

## 2024-12-12 NOTE — Anesthesia Postprocedure Evaluation (Signed)
"   Anesthesia Post Note  Patient: John Hammond  Procedure(s) Performed: CYSTOSCOPY/URETEROSCOPY/HOLMIUM LASER/STENT PLACEMENT (Right)     Patient location during evaluation: PACU Anesthesia Type: General Level of consciousness: awake Pain management: pain level controlled Vital Signs Assessment: post-procedure vital signs reviewed and stable Respiratory status: spontaneous breathing, nonlabored ventilation and respiratory function stable Cardiovascular status: blood pressure returned to baseline and stable Postop Assessment: no apparent nausea or vomiting Anesthetic complications: no   No notable events documented.  Last Vitals:  Vitals:   12/11/24 1200 12/11/24 1215  BP: (!) 127/98 (!) 120/98  Pulse: 74 70  Resp:    Temp:    SpO2: 98% 100%    Last Pain:  Vitals:   12/11/24 1215  TempSrc:   PainSc: 0-No pain                 Carroll Ranney P Shayn Madole      "

## 2024-12-21 ENCOUNTER — Other Ambulatory Visit: Payer: Self-pay | Admitting: Urology

## 2024-12-24 ENCOUNTER — Encounter (HOSPITAL_COMMUNITY): Payer: Self-pay

## 2024-12-24 NOTE — Progress Notes (Signed)
 Patient phoned to give updated information on surgery.  Date of Surgery - 12-27-24  Arrival Time - 11:!5 and check in at admitting.    NPO Status - patient reminded to not eat solid food or drink liquids after midnight the night before surgery.  .   Medications morning of surgery - Alfuzosin, Cetirizine, Finasteride and Tramadol .    Last dose of Zepbound was 12-20-24 and patient was advised to hold until after surgery.   No change in medical history, allergies per patient since surgery on 12-11-24  Transportation home - [facility] .[phone]  All questions answered and patient stated understanding

## 2024-12-24 NOTE — Patient Instructions (Addendum)
 SURGICAL WAITING ROOM VISITATION Patients having surgery or a procedure may have no more than 2 support people in the waiting area - these visitors may rotate.    Children under the age of 24 will not be allowed to visit due to the increase in respiratory illness  Children under the age of 2 must have an adult with them who is not the patient.  If the patient needs to stay at the hospital during part of their recovery, the visitor guidelines for inpatient rooms apply. Pre-op nurse will coordinate an appropriate time for 1 support person to accompany patient in pre-op.  This support person may not rotate.    Please refer to the [facility] website for the visitor guidelines for Inpatients (after your surgery is over and you are in a regular room).       Your procedure is scheduled on: 12-27-24   Report to [facility] Main Entrance    Report to admitting at 11:15 AM   Call this number if you have problems the morning of surgery [phone]   Do not eat food or drink liquids :After Midnight.           If you have questions, please contact your surgeons office.  FOLLOW ANY ADDITIONAL PRE OP INSTRUCTIONS YOU RECEIVED FROM YOUR SURGEON'S OFFICE!!!     Oral Hygiene is also important to reduce your risk of infection.                                    Remember - BRUSH YOUR TEETH THE MORNING OF SURGERY WITH YOUR REGULAR TOOTHPASTE   Do NOT smoke after Midnight   Take these medicines the morning of surgery with A SIP OF WATER:    Alfuzosin (Uroxatral)   Cetirizine (Zyrtec)   Finasteride (Proscar)   Tramadol  (Ultram ) if needed    Stop all vitamins and herbal supplements 7 days before surgery  Hold Zepbound 7 days before surgery (do not take after 12-19-24)  Bring CPAP mask and tubing day of surgery.                              You may not have any metal on your body including  jewelry, and body piercing             Do not wear  lotions, powders, cologne, or  deodorant              Men may shave face and neck.   Do not bring valuables to the hospital. [facility] IS NOT RESPONSIBLE   FOR VALUABLES.   Contacts, dentures or bridgework may not be worn into surgery.  DO NOT BRING YOUR HOME MEDICATIONS TO THE HOSPITAL. PHARMACY WILL DISPENSE MEDICATIONS LISTED ON YOUR MEDICATION LIST TO YOU DURING YOUR ADMISSION IN THE HOSPITAL!    Patients discharged on the day of surgery will not be allowed to drive home.  Someone NEEDS to stay with you for the first 24 hours after anesthesia.   Special Instructions: Bring a copy of your healthcare power of attorney and living will documents the day of surgery if you haven't scanned them before.              Please read over the following fact sheets you were given: IF YOU HAVE QUESTIONS ABOUT YOUR PRE-OP INSTRUCTIONS PLEASE CALL [phone] [place] or [phone]  If you received a COVID test during your pre-op visit  it is requested that you wear a mask when out in public, stay away from anyone that may not be feeling well and notify your surgeon if you develop symptoms. If you test positive for Covid or have been in contact with anyone that has tested positive in the last 10 days please notify you surgeon.  [facility] - Preparing for Surgery Before surgery, you can play an important role.  Because skin is not sterile, your skin needs to be as free of germs as possible.  You can reduce the number of germs on your skin by washing with CHG (chlorahexidine gluconate) soap before surgery.  CHG is an antiseptic cleaner which kills germs and bonds with the skin to continue killing germs even after washing. Please DO NOT use if you have an allergy to CHG or antibacterial soaps.  If your skin becomes reddened/irritated stop using the CHG and inform your nurse when you arrive at Short Stay. Do not shave (including legs and underarms) for at least 48 hours prior to the first CHG shower.  You may shave your face/neck.  Please  follow these instructions carefully:  1.  Shower with CHG Soap the night before surgery and the  morning of surgery.  2.  If you choose to wash your hair, wash your hair first as usual with your normal  shampoo.  3.  After you shampoo, rinse your hair and body thoroughly to remove the shampoo.                             4.  Use CHG as you would any other liquid soap.  You can apply chg directly to the skin and wash.  Gently with a scrungie or clean washcloth.  5.  Apply the CHG Soap to your body ONLY FROM THE NECK DOWN.   Do   not use on face/ open                           Wound or open sores. Avoid contact with eyes, ears mouth and   genitals (private parts).                       Wash face,  Genitals (private parts) with your normal soap.             6.  Wash thoroughly, paying special attention to the area where your    surgery  will be performed.  7.  Thoroughly rinse your body with warm water from the neck down.  8.  DO NOT shower/wash with your normal soap after using and rinsing off the CHG Soap.                9.  Pat yourself dry with a clean towel.            10.  Wear clean pajamas.            11.  Place clean sheets on your bed the night of your first shower and do not  sleep with pets. Day of Surgery : Do not apply any lotions/deodorants the morning of surgery.  Please wear clean clothes to the hospital/surgery center.  FAILURE TO FOLLOW THESE INSTRUCTIONS MAY RESULT IN THE CANCELLATION OF YOUR SURGERY  PATIENT SIGNATURE_________________________________  NURSE SIGNATURE__________________________________  ________________________________________________________________________

## 2024-12-25 ENCOUNTER — Encounter (HOSPITAL_COMMUNITY): Payer: Self-pay

## 2024-12-25 ENCOUNTER — Encounter (HOSPITAL_COMMUNITY)
Admission: RE | Admit: 2024-12-25 | Discharge: 2024-12-25 | Disposition: A | Discharge status: Home / Self Care | Source: Ambulatory Visit | Attending: Urology | Admitting: Urology

## 2024-12-25 ENCOUNTER — Other Ambulatory Visit: Payer: Self-pay | Admitting: Urology

## 2024-12-25 DIAGNOSIS — I10 Essential (primary) hypertension: Secondary | ICD-10-CM

## 2024-12-25 HISTORY — DX: Thoracic aortic aneurysm, without rupture, unspecified: I71.20

## 2024-12-27 ENCOUNTER — Encounter (HOSPITAL_COMMUNITY): Payer: Self-pay | Admitting: Urology

## 2024-12-27 ENCOUNTER — Encounter (HOSPITAL_COMMUNITY): Admission: RE | Disposition: A | Payer: Self-pay | Source: Home / Self Care | Attending: Urology

## 2024-12-27 ENCOUNTER — Ambulatory Visit (HOSPITAL_COMMUNITY)
Admission: RE | Admit: 2024-12-27 | Discharge: 2024-12-27 | Disposition: A | Discharge status: Home / Self Care | Attending: Urology | Admitting: Urology

## 2024-12-27 ENCOUNTER — Ambulatory Visit (HOSPITAL_COMMUNITY): Admitting: Anesthesiology

## 2024-12-27 ENCOUNTER — Encounter (HOSPITAL_COMMUNITY): Payer: Self-pay | Admitting: Medical

## 2024-12-27 ENCOUNTER — Ambulatory Visit (HOSPITAL_COMMUNITY)

## 2024-12-27 DIAGNOSIS — N202 Calculus of kidney with calculus of ureter: Secondary | ICD-10-CM | POA: Insufficient documentation

## 2024-12-27 DIAGNOSIS — N200 Calculus of kidney: Secondary | ICD-10-CM | POA: Diagnosis present

## 2024-12-27 DIAGNOSIS — I10 Essential (primary) hypertension: Secondary | ICD-10-CM

## 2024-12-27 LAB — BASIC METABOLIC PANEL WITH GFR
Anion gap: 13 (ref 5–15)
BUN: 18 mg/dL (ref 8–23)
CO2: 19 mmol/L — ABNORMAL LOW (ref 22–32)
Calcium: 9.4 mg/dL (ref 8.9–10.3)
Chloride: 107 mmol/L (ref 98–111)
Creatinine, Ser: 1.33 mg/dL — ABNORMAL HIGH (ref 0.61–1.24)
GFR, Estimated: 58 mL/min — ABNORMAL LOW
Glucose, Bld: 85 mg/dL (ref 70–99)
Potassium: 4 mmol/L (ref 3.5–5.1)
Sodium: 139 mmol/L (ref 135–145)

## 2024-12-27 MED ORDER — LACTATED RINGERS IV SOLN: INTRAVENOUS | Status: DC | PRN

## 2024-12-27 MED ORDER — LIDOCAINE HCL (PF) 2 % IJ SOLN
INTRAMUSCULAR | Status: DC | PRN
  Administered 2024-12-27: 50 mg via INTRADERMAL

## 2024-12-27 MED ORDER — SUGAMMADEX SODIUM 200 MG/2ML IV SOLN
INTRAVENOUS | Status: AC
  Filled 2024-12-27: qty 2

## 2024-12-27 MED ORDER — ORAL CARE MOUTH RINSE: 15.0000 mL | Freq: Once | OROMUCOSAL | Status: AC

## 2024-12-27 MED ORDER — FENTANYL CITRATE (PF) 50 MCG/ML IJ SOSY
25.0000 ug | PREFILLED_SYRINGE | INTRAMUSCULAR | Status: DC | PRN
  Administered 2024-12-27: 50 ug via INTRAVENOUS

## 2024-12-27 MED ORDER — ROCURONIUM BROMIDE 10 MG/ML (PF) SYRINGE
PREFILLED_SYRINGE | INTRAVENOUS | Status: DC | PRN
  Administered 2024-12-27: 50 mg via INTRAVENOUS

## 2024-12-27 MED ORDER — LIDOCAINE HCL (PF) 2 % IJ SOLN
INTRAMUSCULAR | Status: AC
  Filled 2024-12-27: qty 5

## 2024-12-27 MED ORDER — MIDAZOLAM HCL 2 MG/2ML IJ SOLN
INTRAMUSCULAR | Status: AC
  Filled 2024-12-27: qty 2

## 2024-12-27 MED ORDER — FENTANYL CITRATE (PF) 100 MCG/2ML IJ SOLN
INTRAMUSCULAR | Status: AC
  Filled 2024-12-27: qty 2

## 2024-12-27 MED ORDER — PROPOFOL 10 MG/ML IV BOLUS
INTRAVENOUS | Status: DC | PRN
  Administered 2024-12-27: 200 mg via INTRAVENOUS

## 2024-12-27 MED ORDER — IOHEXOL 300 MG/ML  SOLN
INTRAMUSCULAR | Status: DC | PRN
  Administered 2024-12-27: 10 mL

## 2024-12-27 MED ORDER — DEXAMETHASONE SOD PHOSPHATE PF 10 MG/ML IJ SOLN
INTRAMUSCULAR | Status: AC
  Filled 2024-12-27: qty 1

## 2024-12-27 MED ORDER — OXYCODONE HCL 5 MG/5ML PO SOLN: 5.0000 mg | Freq: Once | ORAL | Status: AC | PRN

## 2024-12-27 MED ORDER — PHENYLEPHRINE 80 MCG/ML (10ML) SYRINGE FOR IV PUSH (FOR BLOOD PRESSURE SUPPORT)
PREFILLED_SYRINGE | INTRAVENOUS | Status: AC
  Filled 2024-12-27: qty 10

## 2024-12-27 MED ORDER — OXYCODONE-ACETAMINOPHEN 5-325 MG PO TABS: 1.0000 | ORAL_TABLET | ORAL | 0 refills | Status: AC | PRN

## 2024-12-27 MED ORDER — SODIUM CHLORIDE 0.9 % IR SOLN
Status: DC | PRN
  Administered 2024-12-27: 3000 mL via INTRAVESICAL

## 2024-12-27 MED ORDER — MIDAZOLAM HCL (PF) 2 MG/2ML IJ SOLN
INTRAMUSCULAR | Status: DC | PRN
  Administered 2024-12-27: 2 mg via INTRAVENOUS

## 2024-12-27 MED ORDER — FENTANYL CITRATE (PF) 50 MCG/ML IJ SOSY
PREFILLED_SYRINGE | INTRAMUSCULAR | Status: AC
  Filled 2024-12-27: qty 2

## 2024-12-27 MED ORDER — ONDANSETRON HCL 4 MG/2ML IJ SOLN
INTRAMUSCULAR | Status: AC
  Filled 2024-12-27: qty 2

## 2024-12-27 MED ORDER — CEFAZOLIN SODIUM 1 G IJ SOLR
INTRAMUSCULAR | Status: AC
  Filled 2024-12-27: qty 20

## 2024-12-27 MED ORDER — SUGAMMADEX SODIUM 200 MG/2ML IV SOLN
INTRAVENOUS | Status: DC | PRN
  Administered 2024-12-27: 200 mg via INTRAVENOUS

## 2024-12-27 MED ORDER — PROPOFOL 10 MG/ML IV BOLUS
INTRAVENOUS | Status: AC
  Filled 2024-12-27: qty 20

## 2024-12-27 MED ORDER — FENTANYL CITRATE (PF) 100 MCG/2ML IJ SOLN
INTRAMUSCULAR | Status: DC | PRN
  Administered 2024-12-27: 100 ug via INTRAVENOUS

## 2024-12-27 MED ORDER — CEFAZOLIN SODIUM-DEXTROSE 2-3 GM-%(50ML) IV SOLR
INTRAVENOUS | Status: DC | PRN
  Administered 2024-12-27: 2 g via INTRAVENOUS

## 2024-12-27 MED ORDER — DEXAMETHASONE SOD PHOSPHATE PF 10 MG/ML IJ SOLN
INTRAMUSCULAR | Status: DC | PRN
  Administered 2024-12-27: 10 mg via INTRAVENOUS

## 2024-12-27 MED ORDER — CHLORHEXIDINE GLUCONATE 0.12 % MT SOLN
15.0000 mL | Freq: Once | OROMUCOSAL | Status: AC
  Administered 2024-12-27: 15 mL via OROMUCOSAL

## 2024-12-27 MED ORDER — CEFAZOLIN SODIUM-DEXTROSE 2-4 GM/100ML-% IV SOLN: 2.0000 g | INTRAVENOUS | Status: DC

## 2024-12-27 MED ORDER — PHENYLEPHRINE 80 MCG/ML (10ML) SYRINGE FOR IV PUSH (FOR BLOOD PRESSURE SUPPORT)
PREFILLED_SYRINGE | INTRAVENOUS | Status: DC | PRN
  Administered 2024-12-27: 80 ug via INTRAVENOUS

## 2024-12-27 MED ORDER — LACTATED RINGERS IV SOLN: INTRAVENOUS | Status: DC

## 2024-12-27 MED ORDER — SUCCINYLCHOLINE CHLORIDE 200 MG/10ML IV SOSY
PREFILLED_SYRINGE | INTRAVENOUS | Status: DC | PRN
  Administered 2024-12-27: 140 mg via INTRAVENOUS

## 2024-12-27 MED ORDER — ONDANSETRON HCL 4 MG/2ML IJ SOLN
INTRAMUSCULAR | Status: DC | PRN
  Administered 2024-12-27: 4 mg via INTRAVENOUS

## 2024-12-27 MED ORDER — DROPERIDOL 2.5 MG/ML IJ SOLN: 0.6250 mg | Freq: Once | INTRAMUSCULAR | Status: DC | PRN

## 2024-12-27 MED ORDER — OXYCODONE HCL 5 MG PO TABS
ORAL_TABLET | ORAL | Status: AC
  Filled 2024-12-27: qty 1

## 2024-12-27 MED ORDER — OXYCODONE HCL 5 MG PO TABS
5.0000 mg | ORAL_TABLET | Freq: Once | ORAL | Status: AC | PRN
  Administered 2024-12-27: 5 mg via ORAL

## 2024-12-27 NOTE — Op Note (Signed)
 Preoperative diagnosis: right ureteral calculus  Postoperative diagnosis: right ureteral calculus  Procedure:  Cystoscopy right ureteroscopy, laser lithotripsy, basket stone extraction Ureteral stent removal (right)  Surgeon: Valli Shank, MD  Anesthesia: General  Complications: None  Intraoperative findings:  Normal urethra Bilateral lobe hypertrophy prostatic urethra Bilateral orthotropic ureteral orifices Bladder mucosa normal without masses   EBL: Minimal  Specimens: right ureteral calculus  Disposition of specimens: [facility] for stone analysis  Indication: John Hammond is a 69 y.o.   patient with large right renal stones and associated right symptoms.  He previously underwent right ureteroscopy with laser lithotripsy.  His stent was expelled postoperatively and found to be in the bladder.  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  After reviewing the management options for treatment, the patient elected to proceed with the above surgical procedure(s). We have discussed the potential benefits and risks of the procedure, side effects of the proposed treatment, the likelihood of the patient achieving the goals of the procedure, and any potential problems that might occur during the procedure or recuperation. Informed consent has been obtained.   Description of procedure:  The patient was taken to the operating room and general anesthesia was induced.  The patient was placed in the dorsal lithotomy position, prepped and draped in the usual sterile fashion, and preoperative antibiotics were administered. A preoperative time-out was performed.   Cystourethroscopy was performed.  The patients urethra was examined and demonstrated bilobar prostatic hypertrophy. The bladder was then systematically examined in its entirety. There was no evidence for any bladder tumors, stones, or other mucosal pathology.    The previously placed ureteral stent was seen in  the bladder.  Graspers used to remove this.  Attention then turned to the  ureteral orifice and a 0.38 sensor guidewire was then advanced up the  ureter into the renal pelvis under fluoroscopic guidance. The 6 Fr semirigid ureteroscope was then advanced into the ureter next to the guidewire and the calculus was identified.   The stone was then fragmented with the 200 micron holmium laser fiber. All stones were then removed from the ureter with a 0 tip basket.  Reinspection of the ureter revealed no remaining visible stones or fragments.   A second sensor wire was advanced through the ureteroscope and up to the kidney and fluoroscopic guidance.  The ureteroscope was removed and 1 wire secured as a safety wire.  The ureteral access sheath was placed over the second wire and up to the proximal ureter with fluoroscopic guidance.  The inner sheath and wire were removed.  Flexible ureteroscopy took place.  He had several punctate calcifications adherent to the bladder mucosa.  There were a few smaller calculi that been previously fragmented that were removed with ZeroTip basket.  The ureteral access sheath was then removed in unison with the ureteroscope taking care to examine the ureter on the way out.  There is no evidence of trauma or edema noted.  The decision was made to not leave a ureteral stent.  The ureter was dilated all the way down to the bladder.  The patient's bladder was decompressed and the cystoscope removed. The patient appeared to tolerate the procedure well and without complications.  The patient was able to be awakened and transferred to the recovery unit in satisfactory condition.   Disposition: Patient will need renal ultrasound in 6 weeks.

## 2024-12-27 NOTE — Anesthesia Procedure Notes (Signed)
 Procedure Name: Intubation Date/Time: 12/27/2024 3:12 PM  Performed by: Obadiah Reyes BROCKS, CRNAPre-anesthesia Checklist: Patient identified, Emergency Drugs available, Suction available and Patient being monitored Patient Re-evaluated:Patient Re-evaluated prior to induction Oxygen Delivery Method: Circle System Utilized Preoxygenation: Pre-oxygenation with 100% oxygen Induction Type: IV induction Ventilation: Mask ventilation without difficulty Laryngoscope Size: Miller and 2 Tube type: Oral Tube size: 7.5 mm Number of attempts: 1 Airway Equipment and Method: Stylet and Oral airway Placement Confirmation: ETT inserted through vocal cords under direct vision, positive ETCO2 and breath sounds checked- equal and bilateral Secured at: 23 cm Tube secured with: Tape Dental Injury: Teeth and Oropharynx as per pre-operative assessment

## 2024-12-27 NOTE — Discharge Instructions (Addendum)
 DISCHARGE INSTRUCTIONS FOR KIDNEY STONE/URETERAL STENT   MEDICATIONS:  1. Resume all your other meds from home  2. AZO over the counter can help with the burning/stinging when you urinate. 3. Oxycodone -acetaminophen  can be used for severe pain  ACTIVITY:  1. No strenuous activity x 1week  2. No driving while on narcotic pain medications  3. Drink plenty of water  4. Continue to walk at home - you can still get blood clots when you are at home, so keep active, but don't over do it.  5. May return to work/school tomorrow or when you feel ready   BATHING:  1. You can shower and we recommend daily showers    SIGNS/SYMPTOMS TO CALL:  Please call us  if you have a fever greater than 101.5, uncontrolled nausea/vomiting, uncontrolled pain, dizziness, unable to urinate, bloody urine, chest pain, shortness of breath, leg swelling, leg pain, redness around wound, drainage from wound, or any other concerns or questions.   You can reach us  at [phone].   FOLLOW-UP:  1.

## 2024-12-27 NOTE — Interval H&P Note (Signed)
 History and Physical Interval Note:  12/27/2024 12:27 PM  John Hammond  has presented today for surgery, with the diagnosis of RIGHT KIDNEY STONE.  The various methods of treatment have been discussed with the patient and family. After consideration of risks, benefits and other options for treatment, the patient has consented to  Procedures with comments: CYSTOSCOPY, WITH CALCULUS MANIPULATION OR REMOVAL (Right) - CYSTOSCOPY WITH RIGHT KIDNEY CALCULUS MANIPULATION OR REMOVAL as a surgical intervention.  The patient's history has been reviewed, patient examined, no change in status, stable for surgery.  I have reviewed the patient's chart and labs.  Questions were answered to the patient's satisfaction.     Carrine Kroboth D Alyvia Derk

## 2024-12-27 NOTE — Anesthesia Preprocedure Evaluation (Signed)
"                                    Anesthesia Evaluation  Patient identified by MRN, date of birth, ID band Patient awake    Reviewed: Allergy & Precautions, NPO status , Patient's Chart, lab work & pertinent test results  Airway Mallampati: I  TM Distance: >3 FB Neck ROM: Full    Dental no notable dental hx.    Pulmonary neg pulmonary ROS   Pulmonary exam normal        Cardiovascular hypertension, Pt. on medications + Peripheral Vascular Disease  Normal cardiovascular exam     Neuro/Psych negative neurological ROS  negative psych ROS   GI/Hepatic negative GI ROS, Neg liver ROS,,,  Endo/Other  Patient on GLP-1 Agonist  Renal/GU Renal InsufficiencyRenal diseasestones     Musculoskeletal negative musculoskeletal ROS (+)    Abdominal  (+) + obese  Peds  Hematology  (+) REFUSES BLOOD PRODUCTS  Anesthesia Other Findings RIGHT KIDNEY STONE  Reproductive/Obstetrics                              Anesthesia Physical Anesthesia Plan  ASA: 3  Anesthesia Plan: General   Post-op Pain Management: Ofirmev  IV (intra-op)*   Induction: Intravenous  PONV Risk Score and Plan: 2 and Ondansetron , Dexamethasone , Midazolam  and Treatment may vary due to age or medical condition  Airway Management Planned: LMA  Additional Equipment:   Intra-op Plan:   Post-operative Plan: Extubation in OR  Informed Consent: I have reviewed the patients History and Physical, chart, labs and discussed the procedure including the risks, benefits and alternatives for the proposed anesthesia with the patient or authorized representative who has indicated his/her understanding and acceptance.     Dental advisory given  Plan Discussed with: CRNA  Anesthesia Plan Comments:          Anesthesia Quick Evaluation  "

## 2024-12-27 NOTE — Anesthesia Postprocedure Evaluation (Signed)
"   Anesthesia Post Note  Patient: John Hammond  Procedure(s) Performed: CYSTOSCOPY, LITHOTRIPSY, WITH CALCULUC MANIPULATION, STENT REMOVAL (Right: Ureter)     Patient location during evaluation: PACU Anesthesia Type: General Level of consciousness: awake and alert Pain management: pain level controlled Vital Signs Assessment: post-procedure vital signs reviewed and stable Respiratory status: spontaneous breathing, nonlabored ventilation, respiratory function stable and patient connected to nasal cannula oxygen Cardiovascular status: blood pressure returned to baseline and stable Postop Assessment: no apparent nausea or vomiting Anesthetic complications: no   No notable events documented.  Last Vitals:  Vitals:   12/27/24 1645 12/27/24 1700  BP: 134/81 122/78  Pulse: 89 81  Resp: 17 15  Temp:  36.6 C  SpO2: 95% 94%    Last Pain:  Vitals:   12/27/24 1700  TempSrc:   PainSc: 3                  Epifanio Lamar BRAVO      "

## 2024-12-27 NOTE — Transfer of Care (Signed)
 Immediate Anesthesia Transfer of Care Note  Patient: John Hammond  Procedure(s) Performed: CYSTOSCOPY, LITHOTRIPSY, WITH CALCULUC MANIPULATION, STENT REMOVAL (Right: Ureter)  Patient Location: PACU  Anesthesia Type:General  Level of Consciousness: awake, alert , and oriented  Airway & Oxygen Therapy: Patient Spontanous Breathing and Patient connected to face mask oxygen  Post-op Assessment: Report given to RN and Post -op Vital signs reviewed and unstable, Anesthesiologist notified  Post vital signs: Reviewed and stable  Last Vitals:  Vitals Value Taken Time  BP 145/92 12/27/24 16:12  Temp 36.6 C 12/27/24 16:12  Pulse 96 12/27/24 16:15  Resp 19 12/27/24 16:15  SpO2 96 % 12/27/24 16:15  Vitals shown include unfiled device data.  Last Pain:  Vitals:   12/27/24 1122  TempSrc: Oral  PainSc:          Complications: No notable events documented.

## 2024-12-28 ENCOUNTER — Encounter (HOSPITAL_COMMUNITY): Payer: Self-pay | Admitting: Urology
